# Patient Record
Sex: Female | Born: 1935 | Race: White | Hispanic: No | Smoking: Never smoker
Health system: Southern US, Community
[De-identification: ages and names within clinical notes are randomized; demographics above are authoritative.]

## PROBLEM LIST (undated history)

## (undated) DIAGNOSIS — G4730 Sleep apnea, unspecified: Secondary | ICD-10-CM

## (undated) DIAGNOSIS — E039 Hypothyroidism, unspecified: Secondary | ICD-10-CM

## (undated) DIAGNOSIS — Z9981 Dependence on supplemental oxygen: Secondary | ICD-10-CM

## (undated) DIAGNOSIS — N189 Chronic kidney disease, unspecified: Secondary | ICD-10-CM

## (undated) DIAGNOSIS — Z5189 Encounter for other specified aftercare: Secondary | ICD-10-CM

## (undated) DIAGNOSIS — J449 Chronic obstructive pulmonary disease, unspecified: Secondary | ICD-10-CM

## (undated) DIAGNOSIS — I509 Heart failure, unspecified: Secondary | ICD-10-CM

## (undated) DIAGNOSIS — I10 Essential (primary) hypertension: Secondary | ICD-10-CM

## (undated) DIAGNOSIS — G459 Transient cerebral ischemic attack, unspecified: Secondary | ICD-10-CM

## (undated) HISTORY — PX: KNEE FUSION: SUR623

## (undated) HISTORY — PX: JOINT REPLACEMENT: SHX530

## (undated) HISTORY — PX: FRACTURE SURGERY: SHX138

---

## 2006-02-16 ENCOUNTER — Ambulatory Visit: Payer: Self-pay | Admitting: Cardiology

## 2010-08-11 ENCOUNTER — Ambulatory Visit: Payer: Self-pay | Admitting: Pulmonary Disease

## 2010-08-11 ENCOUNTER — Inpatient Hospital Stay (HOSPITAL_COMMUNITY): Admission: EM | Admit: 2010-08-11 | Discharge: 2010-08-15 | Payer: Self-pay | Admitting: Internal Medicine

## 2010-08-12 ENCOUNTER — Encounter (INDEPENDENT_AMBULATORY_CARE_PROVIDER_SITE_OTHER): Payer: Self-pay | Admitting: Internal Medicine

## 2010-08-14 ENCOUNTER — Encounter: Payer: Self-pay | Admitting: Internal Medicine

## 2010-08-14 ENCOUNTER — Ambulatory Visit: Payer: Self-pay | Admitting: Thoracic Surgery

## 2010-08-20 ENCOUNTER — Ambulatory Visit: Payer: Self-pay | Admitting: Thoracic Surgery

## 2010-08-29 ENCOUNTER — Inpatient Hospital Stay (HOSPITAL_COMMUNITY)
Admission: EM | Admit: 2010-08-29 | Discharge: 2010-09-09 | Payer: Self-pay | Attending: Thoracic Surgery | Admitting: Thoracic Surgery

## 2010-09-02 ENCOUNTER — Encounter: Payer: Self-pay | Admitting: Thoracic Surgery

## 2010-09-02 ENCOUNTER — Inpatient Hospital Stay: Admission: RE | Admit: 2010-09-02 | Payer: Self-pay | Source: Home / Self Care | Admitting: Thoracic Surgery

## 2010-09-17 ENCOUNTER — Encounter
Admission: RE | Admit: 2010-09-17 | Discharge: 2010-09-17 | Payer: Self-pay | Source: Home / Self Care | Attending: Thoracic Surgery | Admitting: Thoracic Surgery

## 2010-09-17 ENCOUNTER — Ambulatory Visit: Payer: Self-pay | Admitting: Thoracic Surgery

## 2010-09-25 ENCOUNTER — Encounter: Payer: Self-pay | Admitting: Infectious Diseases

## 2010-10-21 NOTE — Consult Note (Signed)
Summary: [facility]   Imported By: Sherian Rein 08/21/2010 09:31:27  _____________________________________________________________________  External Attachment:    Type:   Image     Comment:   External Document

## 2010-10-23 NOTE — Consult Note (Signed)
[facility]: [facility]   Imported By: Florinda Marker 10/02/2010 08:38:13  _____________________________________________________________________  External Attachment:    Type:   Image     Comment:   External Document

## 2010-11-20 NOTE — Consult Note (Signed)
Veronica Mcdonald, Veronica Mcdonald                  ACCOUNT NO.:  [id number]  MEDICAL RECORD NO.:  [id number]          PATIENT TYPE:  INP  LOCATION:  3311                         FACILITY:  [facility]  PHYSICIAN:  Learta Codding, MD,FACC DATE OF BIRTH:  Apr 08, 1930  DATE OF CONSULTATION: DATE OF DISCHARGE:                                CONSULTATION   REFERRING PHYSICIAN:  Ines Bloomer, M.D.  REASON FOR CONSULTATION:  Evaluation of possible paroxysmal atrial fibrillation postoperatively.  HISTORY OF PRESENT ILLNESS:  The patient is a 77 year old female with a prior history of COPD on home oxygen, hypertension, questionable congestive heart failure, and questionable prior CVA.  The patient, per her records, reports that she has had a myocardial infarction and a cardiac catheterization, but no records were found to corroborate this finding.  The patient was referred by Dr. Sherryll Burger to Dr. Edwyna Shell because of increased difficulty of breathing and swallowing difficulties.  The patient has a known substantial bilateral substernal goiter, right greater than left.  When the patient was referred, CT scan showed mild tracheal compression, and the left substernal component has gotten large and there was also compression of the esophagus.  The patient underwent successful surgery.  She has been extubated.  She is now much improved. She does state that she is already able to swallow better and has no breathing difficulty.  She reports no chest pains.  She stated that preoperatively she had occasional palpitations, but reports none currently.  Telemetry demonstrates that the patient has occasional PACs and rare PVCs, but otherwise no significant abnormalities.  There is no evidence of atrial fibrillation.  Interestingly, the patient told me that she had a prior cardiac catheterization, but I could not find any report in our medical records regarding this.  Based on her EKG, I am also not sure that she truly has a  prior history of significant myocardial infarction.  She does have a history of COPD and chronic bronchitis.  ALLERGIES:  VANCOMYCIN, BACTRIM, LEVAQUIN, PENICILLIN, CEPHALOSPORIN.  PAST MEDICAL HISTORY: 1. Right total hip replacement. 2. Left total knee replacement subsequently with rod placement. 3. 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 4. CHF in the past. 5. Renal insufficiency. 6. Hyperkalemia. 7. COPD.  Her COPD has been followed by Dr. Maple Hudson. 8. Thyroid goiter, intrathoracic status, now post removal. 9. Incidental 1 cm left upper lobe nodule. 10.Pneumonia.  FAMILY HISTORY:  Negative for coronary artery disease.  SOCIAL HISTORY:  The patient is widowed.  The patient has grown children.  She ambulates with a walker and special boots.  No use of alcohol or tobacco.  Her daughter works as a Engineer, civil (consulting) in [place].  HOME MEDICATIONS:  Prior to admission; 1. Lisinopril. 2. Hydrocodone. 3. Gabapentin. 4. Albuterol.  CURRENT MEDICATIONS: 1. Several inhalers. 2. Enoxaparin for DVT prophylaxis. 3. Levothyroxine. 4. Metoprolol 12.5 mg p.o. b.i.d. 5. Prednisone. 6. Albuterol. 7. Tramadol.  REVIEW OF SYSTEMS:  As per HPI.  No nausea or vomiting, no fever or chills, no melena, or hematochezia.  No urinary frequency.  Preoperative palpitations.  No syncope.  Remainder of the  review of systems is otherwise negative.  PHYSICAL EXAMINATION:  VITAL SIGNS:  Blood pressure is 130/70, heart rate is 87 beats per minute, respirations are 21, 94% on 2 L oxygen. GENERAL:  Overweight white female, in no apparent distress. HEENT:  Pupils isocoric.  Conjunctivae clear. NECK:  Supple.  Normal carotid upstroke, no carotid bruit.  Status post thyroidectomy with well-healed scar in the lower part of the neck.  No cervical or supraclavicular adenopathy. LUNGS:  There are diminished breath sounds bilaterally. HEART:  Regular rate and rhythm.  Normal S1 and S2.  No murmur, rubs,  or gallops. ABDOMEN:  Soft, nontender with no rebound or guarding.  Good bowel sounds. EXTREMITIES:  No cyanosis, clubbing, or edema.  The patient wears a boot on the left leg.  Her left leg is shortened.  Surgical absence of the patella and deformity of the left knee. SKIN:  Warm and dry. NEUROLOGIC:  The patient is alert, oriented, and grossly nonfocal. Psychiatric, normal affect.  LABORATORY WORK:  White count is 13.4, hemoglobin is 13.2, creatinine is 0.54, glucose is 137, BUN is 137.  MRSA PCR is negative.  A 12-lead electrocardiogram shows normal sinus rhythm with inverted T- waves across the precordial leads.  PROBLEM LIST: 1. Rule out paroxysmal atrial ablation. 2. PAC and PVCs. 3. No definite history of coronary artery disease, although the chart     reports prior myocardial infarction, not documented. 4. Status post thyroidectomy for obstructing substernal enlarged     thyroid, compromising airway and esophagus. 5. COPD.  PLAN: 1. From a cardiac standpoint, there is actually little to do at this     point in time.  The patient is hemodynamically stable.  She has no     pathological murmurs on exam.  There is no indication to obtain an     echocardiogram. 2. I do not see any evidence of paroxysmal atrial fibrillation.  She     can continue on subcu Lovenox.  We could potentially increase her     metoprolol slightly.  Although she truly has paroxysmal atrial     fibrillation, calcium blocker probably would be a better choice.     The patient actually is on carvedilol 3.125 mg twice a day.  Again,     this is a nonselective beta blocker, and therefore, if she ought to     have wheezing with this, this can be discontinued in favor of     metoprolol.  The patient is actually brought on metoprolol and     carvedilol, and therefore we will stop carvedilol and continue on     metoprolol. 3. From a cardiac standpoint again, no further management issues     regarding her  cardiac status are needed.     Learta Codding, MD,FACC     GED/MEDQ  D:  09/06/2010  T:  09/07/2010  Job:  [id number]  cc:   Dr. Maple Hudson Dr. Sherryll Burger  Electronically Signed by Lewayne Bunting MDFACC on 11/20/2010 10:24:35 AM

## 2010-12-01 LAB — COMPREHENSIVE METABOLIC PANEL
ALT: 20 U/L (ref 0–35)
AST: 11 U/L (ref 0–37)
CO2: 30 mEq/L (ref 19–32)
Calcium: 8.5 mg/dL (ref 8.4–10.5)
Chloride: 103 mEq/L (ref 96–112)
Creatinine, Ser: 0.63 mg/dL (ref 0.4–1.2)
GFR calc Af Amer: >60 mL/min (ref >60)
GFR calc non Af Amer: >60 mL/min (ref >60)
Glucose, Bld: 123 mg/dL — ABNORMAL HIGH (ref 70–99)
Total Bilirubin: 1 mg/dL (ref 0.3–1.2)

## 2010-12-01 LAB — CBC
HCT: 40.7 % (ref 36.0–46.0)
HCT: 40.7 % (ref 36.0–46.0)
HCT: 41.6 % (ref 36.0–46.0)
Hemoglobin: 12.9 g/dL (ref 12.0–15.0)
Hemoglobin: 13.2 g/dL (ref 12.0–15.0)
Hemoglobin: 13.4 g/dL (ref 12.0–15.0)
MCH: 29.5 pg (ref 26.0–34.0)
MCHC: 31.7 g/dL (ref 30.0–36.0)
MCHC: 32.4 g/dL (ref 30.0–36.0)
MCV: 92.9 fL (ref 78.0–100.0)
MCV: 93.3 fL (ref 78.0–100.0)
RBC: 4.38 MIL/uL (ref 3.87–5.11)
RBC: 4.43 MIL/uL (ref 3.87–5.11)
RBC: 4.46 MIL/uL (ref 3.87–5.11)
RDW: 12.8 % (ref 11.5–15.5)
WBC: 14 10*3/uL — ABNORMAL HIGH (ref 4.0–10.5)

## 2010-12-01 LAB — POCT I-STAT 3, ART BLOOD GAS (G3+)
Acid-Base Excess: 1 mmol/L (ref 0.0–2.0)
Acid-Base Excess: 6 mmol/L — ABNORMAL HIGH (ref 0.0–2.0)
Bicarbonate: 26 mEq/L — ABNORMAL HIGH (ref 20.0–24.0)
Bicarbonate: 29.6 mEq/L — ABNORMAL HIGH (ref 20.0–24.0)
Bicarbonate: 36.5 mEq/L — ABNORMAL HIGH (ref 20.0–24.0)
O2 Saturation: 98 %
Patient temperature: 98.7
Patient temperature: 98.9
TCO2: 38 mmol/L (ref 0–100)
pCO2 arterial: 51.8 mmHg — ABNORMAL HIGH (ref 35.0–45.0)
pCO2 arterial: 59.1 mmHg (ref 35.0–45.0)
pH, Arterial: 7.364 (ref 7.350–7.400)
pH, Arterial: 7.397 (ref 7.350–7.400)
pH, Arterial: 7.406 — ABNORMAL HIGH (ref 7.350–7.400)
pO2, Arterial: 102 mmHg — ABNORMAL HIGH (ref 80.0–100.0)
pO2, Arterial: 104 mmHg — ABNORMAL HIGH (ref 80.0–100.0)
pO2, Arterial: 86 mmHg (ref 80.0–100.0)
pO2, Arterial: 93 mmHg (ref 80.0–100.0)

## 2010-12-01 LAB — RENAL FUNCTION PANEL
Albumin: 2.6 g/dL — ABNORMAL LOW (ref 3.5–5.2)
BUN: 17 mg/dL (ref 6–23)
CO2: 33 mEq/L — ABNORMAL HIGH (ref 19–32)
CO2: 34 mEq/L — ABNORMAL HIGH (ref 19–32)
Calcium: 8.2 mg/dL — ABNORMAL LOW (ref 8.4–10.5)
Chloride: 100 mEq/L (ref 96–112)
Chloride: 103 mEq/L (ref 96–112)
Creatinine, Ser: 0.6 mg/dL (ref 0.4–1.2)
GFR calc Af Amer: >60 mL/min (ref >60)
GFR calc non Af Amer: >60 mL/min (ref >60)
Glucose, Bld: 124 mg/dL — ABNORMAL HIGH (ref 70–99)
Glucose, Bld: 85 mg/dL (ref 70–99)
Phosphorus: 3.3 mg/dL (ref 2.3–4.6)
Potassium: 3.8 mEq/L (ref 3.5–5.1)
Potassium: 4.2 mEq/L (ref 3.5–5.1)
Sodium: 139 mEq/L (ref 135–145)
Sodium: 140 mEq/L (ref 135–145)
Sodium: 141 mEq/L (ref 135–145)

## 2010-12-01 LAB — GLUCOSE, CAPILLARY
Glucose-Capillary: 100 mg/dL — ABNORMAL HIGH (ref 70–99)
Glucose-Capillary: 123 mg/dL — ABNORMAL HIGH (ref 70–99)
Glucose-Capillary: 129 mg/dL — ABNORMAL HIGH (ref 70–99)
Glucose-Capillary: 131 mg/dL — ABNORMAL HIGH (ref 70–99)
Glucose-Capillary: 136 mg/dL — ABNORMAL HIGH (ref 70–99)
Glucose-Capillary: 141 mg/dL — ABNORMAL HIGH (ref 70–99)
Glucose-Capillary: 172 mg/dL — ABNORMAL HIGH (ref 70–99)
Glucose-Capillary: 185 mg/dL — ABNORMAL HIGH (ref 70–99)
Glucose-Capillary: 86 mg/dL (ref 70–99)
Glucose-Capillary: 90 mg/dL (ref 70–99)

## 2010-12-01 LAB — CULTURE, RESPIRATORY W GRAM STAIN

## 2010-12-01 LAB — BASIC METABOLIC PANEL
CO2: 35 mEq/L — ABNORMAL HIGH (ref 19–32)
Calcium: 8.7 mg/dL (ref 8.4–10.5)
Creatinine, Ser: 0.54 mg/dL (ref 0.4–1.2)
GFR calc Af Amer: >60 mL/min (ref >60)

## 2010-12-01 LAB — MRSA PCR SCREENING: MRSA by PCR: NEGATIVE

## 2010-12-02 LAB — CROSSMATCH
ABO/RH(D): A NEG
Unit division: 0

## 2010-12-02 LAB — BASIC METABOLIC PANEL
CO2: 28 mEq/L (ref 19–32)
CO2: 32 mEq/L (ref 19–32)
Calcium: 8.5 mg/dL (ref 8.4–10.5)
Chloride: 105 mEq/L (ref 96–112)
Chloride: 105 mEq/L (ref 96–112)
Chloride: 106 mEq/L (ref 96–112)
Chloride: 107 mEq/L (ref 96–112)
Chloride: 99 mEq/L (ref 96–112)
GFR calc Af Amer: >60 mL/min (ref >60)
GFR calc Af Amer: >60 mL/min (ref >60)
GFR calc Af Amer: >60 mL/min (ref >60)
GFR calc non Af Amer: >60 mL/min (ref >60)
GFR calc non Af Amer: >60 mL/min (ref >60)
GFR calc non Af Amer: >60 mL/min (ref >60)
Glucose, Bld: 100 mg/dL — ABNORMAL HIGH (ref 70–99)
Glucose, Bld: 104 mg/dL — ABNORMAL HIGH (ref 70–99)
Glucose, Bld: 108 mg/dL — ABNORMAL HIGH (ref 70–99)
Potassium: 3.1 mEq/L — ABNORMAL LOW (ref 3.5–5.1)
Potassium: 3.1 mEq/L — ABNORMAL LOW (ref 3.5–5.1)
Potassium: 3.7 mEq/L (ref 3.5–5.1)
Potassium: 3.9 mEq/L (ref 3.5–5.1)
Potassium: 4.1 mEq/L (ref 3.5–5.1)
Sodium: 140 mEq/L (ref 135–145)
Sodium: 141 mEq/L (ref 135–145)
Sodium: 143 mEq/L (ref 135–145)
Sodium: 143 mEq/L (ref 135–145)
Sodium: 144 mEq/L (ref 135–145)

## 2010-12-02 LAB — POCT I-STAT 3, ART BLOOD GAS (G3+)
Acid-Base Excess: 4 mmol/L — ABNORMAL HIGH (ref 0.0–2.0)
Bicarbonate: 32.4 mEq/L — ABNORMAL HIGH (ref 20.0–24.0)
Bicarbonate: 32.9 mEq/L — ABNORMAL HIGH (ref 20.0–24.0)
O2 Saturation: 100 %
Patient temperature: 97.7
Patient temperature: 98.5
Patient temperature: 98.8
TCO2: 34 mmol/L (ref 0–100)
pH, Arterial: 7.194 — CL (ref 7.350–7.400)
pO2, Arterial: 95 mmHg (ref 80.0–100.0)

## 2010-12-02 LAB — BLOOD GAS, ARTERIAL
Bicarbonate: 31.3 mEq/L — ABNORMAL HIGH (ref 20.0–24.0)
Drawn by: 32526
FIO2: 0.21 %
Patient temperature: 98.6
pH, Arterial: 7.372 (ref 7.350–7.400)
pH, Arterial: 7.446 — ABNORMAL HIGH (ref 7.350–7.400)
pO2, Arterial: 88.3 mmHg (ref 80.0–100.0)

## 2010-12-02 LAB — COMPREHENSIVE METABOLIC PANEL
ALT: 26 U/L (ref 0–35)
AST: 13 U/L (ref 0–37)
Albumin: 3.4 g/dL — ABNORMAL LOW (ref 3.5–5.2)
Alkaline Phosphatase: 79 U/L (ref 39–117)
BUN: 13 mg/dL (ref 6–23)
BUN: 19 mg/dL (ref 6–23)
BUN: 23 mg/dL (ref 6–23)
Calcium: 7.9 mg/dL — ABNORMAL LOW (ref 8.4–10.5)
Calcium: 9.8 mg/dL (ref 8.4–10.5)
Chloride: 103 mEq/L (ref 96–112)
GFR calc non Af Amer: >60 mL/min (ref >60)
Glucose, Bld: 109 mg/dL — ABNORMAL HIGH (ref 70–99)
Glucose, Bld: 140 mg/dL — ABNORMAL HIGH (ref 70–99)
Glucose, Bld: 172 mg/dL — ABNORMAL HIGH (ref 70–99)
Potassium: 3.3 mEq/L — ABNORMAL LOW (ref 3.5–5.1)
Potassium: 4.9 mEq/L (ref 3.5–5.1)
Sodium: 138 mEq/L (ref 135–145)
Sodium: 142 mEq/L (ref 135–145)
Total Bilirubin: 0.9 mg/dL (ref 0.3–1.2)
Total Bilirubin: 1 mg/dL (ref 0.3–1.2)
Total Protein: 6.7 g/dL (ref 6.0–8.3)

## 2010-12-02 LAB — CBC
HCT: 42.9 % (ref 36.0–46.0)
HCT: 45.4 % (ref 36.0–46.0)
HCT: 47.2 % — ABNORMAL HIGH (ref 36.0–46.0)
HCT: 50 % — ABNORMAL HIGH (ref 36.0–46.0)
HCT: 50.5 % — ABNORMAL HIGH (ref 36.0–46.0)
Hemoglobin: 13.5 g/dL (ref 12.0–15.0)
Hemoglobin: 14.4 g/dL (ref 12.0–15.0)
Hemoglobin: 15.2 g/dL — ABNORMAL HIGH (ref 12.0–15.0)
Hemoglobin: 15.8 g/dL — ABNORMAL HIGH (ref 12.0–15.0)
Hemoglobin: 16.2 g/dL — ABNORMAL HIGH (ref 12.0–15.0)
MCH: 29.7 pg (ref 26.0–34.0)
MCH: 30.2 pg (ref 26.0–34.0)
MCHC: 31.5 g/dL (ref 30.0–36.0)
MCHC: 31.9 g/dL (ref 30.0–36.0)
MCHC: 31.9 g/dL (ref 30.0–36.0)
MCV: 93.3 fL (ref 78.0–100.0)
MCV: 93.9 fL (ref 78.0–100.0)
MCV: 94 fL (ref 78.0–100.0)
MCV: 94.3 fL (ref 78.0–100.0)
MCV: 96.2 fL (ref 78.0–100.0)
Platelets: 100 10*3/uL — ABNORMAL LOW (ref 150–400)
Platelets: 107 10*3/uL — ABNORMAL LOW (ref 150–400)
Platelets: 130 10*3/uL — ABNORMAL LOW (ref 150–400)
RBC: 4.88 MIL/uL (ref 3.87–5.11)
RBC: 4.99 MIL/uL (ref 3.87–5.11)
RBC: 5.36 MIL/uL — ABNORMAL HIGH (ref 3.87–5.11)
RBC: 5.37 MIL/uL — ABNORMAL HIGH (ref 3.87–5.11)
RDW: 12.8 % (ref 11.5–15.5)
RDW: 12.8 % (ref 11.5–15.5)
RDW: 12.9 % (ref 11.5–15.5)
WBC: 11 10*3/uL — ABNORMAL HIGH (ref 4.0–10.5)
WBC: 16.9 10*3/uL — ABNORMAL HIGH (ref 4.0–10.5)
WBC: 8.1 10*3/uL (ref 4.0–10.5)
WBC: 8.2 10*3/uL (ref 4.0–10.5)
WBC: 9.2 10*3/uL (ref 4.0–10.5)

## 2010-12-02 LAB — DIFFERENTIAL
Basophils Absolute: 0 10*3/uL (ref 0.0–0.1)
Basophils Relative: 0 % (ref 0–1)
Eosinophils Absolute: 0 10*3/uL (ref 0.0–0.7)
Eosinophils Absolute: 0.2 10*3/uL (ref 0.0–0.7)
Eosinophils Relative: 3 % (ref 0–5)
Lymphocytes Relative: 20 % (ref 12–46)
Lymphs Abs: 1.5 10*3/uL (ref 0.7–4.0)
Monocytes Absolute: 0.8 10*3/uL (ref 0.1–1.0)
Monocytes Absolute: 1 10*3/uL (ref 0.1–1.0)
Monocytes Relative: 10 % (ref 3–12)
Neutro Abs: 8.2 10*3/uL — ABNORMAL HIGH (ref 1.7–7.7)

## 2010-12-02 LAB — CARDIAC PANEL(CRET KIN+CKTOT+MB+TROPI)
CK, MB: 2.1 ng/mL (ref 0.3–4.0)
CK, MB: 2.3 ng/mL (ref 0.3–4.0)
CK, MB: 3.4 ng/mL (ref 0.3–4.0)
Relative Index: INVALID (ref 0.0–2.5)
Troponin I: 0.02 ng/mL (ref 0.00–0.06)
Troponin I: 0.05 ng/mL (ref 0.00–0.06)
Troponin I: 0.07 ng/mL — ABNORMAL HIGH (ref 0.00–0.06)

## 2010-12-02 LAB — GLUCOSE, CAPILLARY
Glucose-Capillary: 123 mg/dL — ABNORMAL HIGH (ref 70–99)
Glucose-Capillary: 146 mg/dL — ABNORMAL HIGH (ref 70–99)
Glucose-Capillary: 150 mg/dL — ABNORMAL HIGH (ref 70–99)
Glucose-Capillary: 156 mg/dL — ABNORMAL HIGH (ref 70–99)

## 2010-12-02 LAB — URINALYSIS, MICROSCOPIC ONLY
Glucose, UA: NEGATIVE mg/dL
Hgb urine dipstick: NEGATIVE
Protein, ur: 30 mg/dL — AB
Specific Gravity, Urine: 1.015 (ref 1.005–1.030)
pH: 6 (ref 5.0–8.0)

## 2010-12-02 LAB — APTT: aPTT: 28 seconds (ref 24–37)

## 2010-12-02 LAB — CALCIUM: Calcium: 7.7 mg/dL — ABNORMAL LOW (ref 8.4–10.5)

## 2010-12-02 LAB — PROTIME-INR
INR: 1.13 (ref 0.00–1.49)
Prothrombin Time: 14.7 seconds (ref 11.6–15.2)

## 2010-12-02 LAB — T4, FREE: Free T4: 1.2 ng/dL (ref 0.80–1.80)

## 2010-12-02 LAB — MRSA PCR SCREENING: MRSA by PCR: NEGATIVE

## 2010-12-02 LAB — ABO/RH: ABO/RH(D): A NEG

## 2010-12-02 LAB — MAGNESIUM: Magnesium: 2.3 mg/dL (ref 1.5–2.5)

## 2010-12-02 LAB — HEPARIN LEVEL (UNFRACTIONATED): Heparin Unfractionated: <0.1 IU/mL — ABNORMAL LOW (ref 0.30–0.70)

## 2011-02-03 NOTE — Letter (Signed)
August 20, 2010   Gloris Manchester. [facility], MD  [address]  [place], [place] [address]   Re:  Veronica Mcdonald, Veronica Mcdonald             DOB:  11/14/1929   Dear Zola Button:   I saw the patient in the office today, and I think we discussed with  your office about tentatively doing the surgery on September 02, 2010.  The patient is looking reasonably well.  She is swallowing the liquids  well but still has trouble with soft foods and as long as she is not  very active has no problems with her dyspnea.  She still is on home O2.  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  I explained to her the risk of the procedure and that  she may need be on a ventilator for several days.  I also explained the  risks of hypocalcemia and recurrent laryngeal nerve problems.  I think  we can start off through the neck and decide whether to just do a right  thyroid lobectomy or subtotaled thyroid lobectomy and then see if we can  get the posterior mediastinal mass out through the neck.  If we cannot,  then I will have to proceed with a right thoracotomy to remove it.  Again, we went over in great detail the risk of the surgery and she  agrees to the surgery and understands the risks.  Her blood pressure was  124/80, pulse 80, respirations 18, sats were 93% on 2 L.   Ines Bloomer, M.D.  Electronically Signed   DPB/MEDQ  D:  08/20/2010  T:  08/21/2010  Job:  [id number]   cc:   Kirstie Peri, MD

## 2011-02-03 NOTE — Letter (Signed)
September 17, 2010   Gloris Manchester. [facility], MD  [address]  [place], [place] [address]   Re:  CAROLL, WEINHEIMER                  DOB:  08-02-1930   Dear Zola Button,   I saw the patient back today.  She said that she had had a family  emergency, so that I would not see her for awhile, so I went ahead and  removed her sutures.  Her wound is healed well and swelling has  decreased.  She is on calcium.  Only problem is hoarseness, which I  worry about a recurrent laryngeal nerve problem, but I let you evaluate  that when you see her.  Her chest x-ray today shows marked decrease in  her mediastinum and the area where her large thyroid gland is decreased.  I discussed the situation with the family and so far they are very  pleased with results.  I will be happy to see her again if she has any  future problems.   Ines Bloomer, M.D.  Electronically Signed   DPB/MEDQ  D:  09/17/2010  T:  09/18/2010  Job:  [id number]   cc:   Letha Cape, PA  Kirstie Peri, MD

## 2011-03-22 HISTORY — PX: THYROIDECTOMY: SHX17

## 2011-04-10 DIAGNOSIS — I509 Heart failure, unspecified: Secondary | ICD-10-CM

## 2011-04-10 DIAGNOSIS — R0602 Shortness of breath: Secondary | ICD-10-CM

## 2011-08-15 ENCOUNTER — Encounter (HOSPITAL_COMMUNITY): Payer: Self-pay | Admitting: Anesthesiology

## 2011-08-15 ENCOUNTER — Emergency Department (HOSPITAL_COMMUNITY): Payer: Medicare Other

## 2011-08-15 ENCOUNTER — Inpatient Hospital Stay: Admit: 2011-08-15 | Payer: Self-pay | Admitting: Orthopedic Surgery

## 2011-08-15 ENCOUNTER — Encounter (HOSPITAL_COMMUNITY)
Admission: EM | Disposition: A | Discharge status: Skilled Nursing Facility | Payer: Self-pay | Source: Home / Self Care | Attending: Orthopedic Surgery

## 2011-08-15 ENCOUNTER — Emergency Department (HOSPITAL_COMMUNITY): Payer: Medicare Other | Admitting: Anesthesiology

## 2011-08-15 ENCOUNTER — Inpatient Hospital Stay (HOSPITAL_COMMUNITY)
Admission: EM | Admit: 2011-08-15 | Discharge: 2011-08-19 | DRG: 502 | Disposition: A | Discharge status: Skilled Nursing Facility | Payer: Medicare Other | Attending: Orthopedic Surgery | Admitting: Orthopedic Surgery

## 2011-08-15 ENCOUNTER — Other Ambulatory Visit: Payer: Self-pay

## 2011-08-15 DIAGNOSIS — W19XXXA Unspecified fall, initial encounter: Secondary | ICD-10-CM | POA: Diagnosis present

## 2011-08-15 DIAGNOSIS — IMO0002 Reserved for concepts with insufficient information to code with codable children: Secondary | ICD-10-CM

## 2011-08-15 DIAGNOSIS — Z79899 Other long term (current) drug therapy: Secondary | ICD-10-CM

## 2011-08-15 DIAGNOSIS — S52599A Other fractures of lower end of unspecified radius, initial encounter for closed fracture: Principal | ICD-10-CM | POA: Diagnosis present

## 2011-08-15 DIAGNOSIS — S52502A Unspecified fracture of the lower end of left radius, initial encounter for closed fracture: Secondary | ICD-10-CM

## 2011-08-15 DIAGNOSIS — I10 Essential (primary) hypertension: Secondary | ICD-10-CM | POA: Diagnosis present

## 2011-08-15 DIAGNOSIS — S0003XA Contusion of scalp, initial encounter: Secondary | ICD-10-CM | POA: Diagnosis present

## 2011-08-15 DIAGNOSIS — S0083XA Contusion of other part of head, initial encounter: Secondary | ICD-10-CM

## 2011-08-15 DIAGNOSIS — Z6839 Body mass index (BMI) 39.0-39.9, adult: Secondary | ICD-10-CM

## 2011-08-15 HISTORY — DX: Essential (primary) hypertension: I10

## 2011-08-15 HISTORY — PX: ORIF WRIST FRACTURE: SHX2133

## 2011-08-15 LAB — DIFFERENTIAL
Basophils Absolute: 0 10*3/uL (ref 0.0–0.1)
Lymphocytes Relative: 15 % (ref 12–46)
Lymphs Abs: 1.8 10*3/uL (ref 0.7–4.0)
Monocytes Absolute: 0.9 10*3/uL (ref 0.1–1.0)
Neutro Abs: 8.9 10*3/uL — ABNORMAL HIGH (ref 1.7–7.7)

## 2011-08-15 LAB — BASIC METABOLIC PANEL
CO2: 29 mEq/L (ref 19–32)
Chloride: 101 mEq/L (ref 96–112)
Creatinine, Ser: 0.73 mg/dL (ref 0.50–1.10)
Glucose, Bld: 108 mg/dL — ABNORMAL HIGH (ref 70–99)

## 2011-08-15 LAB — CBC
HCT: 40.9 % (ref 36.0–46.0)
Hemoglobin: 13.2 g/dL (ref 12.0–15.0)
MCV: 95.1 fL (ref 78.0–100.0)
RBC: 4.3 MIL/uL (ref 3.87–5.11)
RDW: 13 % (ref 11.5–15.5)
WBC: 11.8 10*3/uL — ABNORMAL HIGH (ref 4.0–10.5)

## 2011-08-15 LAB — SAMPLE TO BLOOD BANK

## 2011-08-15 SURGERY — OPEN REDUCTION INTERNAL FIXATION (ORIF) WRIST FRACTURE
Anesthesia: Monitor Anesthesia Care | Site: Wrist | Laterality: Left | Wound class: Clean

## 2011-08-15 MED ORDER — OXYCODONE-ACETAMINOPHEN 5-325 MG PO TABS
1.0000 | ORAL_TABLET | ORAL | Status: DC | PRN
  Administered 2011-08-15 – 2011-08-19 (×6): 2 via ORAL
  Filled 2011-08-15 (×6): qty 2

## 2011-08-15 MED ORDER — ENOXAPARIN SODIUM 40 MG/0.4ML ~~LOC~~ SOLN
40.0000 mg | Freq: Every day | SUBCUTANEOUS | Status: DC
  Administered 2011-08-16 – 2011-08-19 (×4): 40 mg via SUBCUTANEOUS
  Filled 2011-08-15 (×4): qty 0.4

## 2011-08-15 MED ORDER — HYDROMORPHONE HCL PF 1 MG/ML IJ SOLN
0.5000 mg | INTRAMUSCULAR | Status: DC | PRN
  Administered 2011-08-15: 0.5 mg via INTRAVENOUS
  Administered 2011-08-15 – 2011-08-16 (×7): 1 mg via INTRAVENOUS
  Administered 2011-08-16: 0.5 mg via INTRAVENOUS
  Administered 2011-08-17: 1 mg via INTRAVENOUS
  Filled 2011-08-15 (×10): qty 1

## 2011-08-15 MED ORDER — PROPOFOL 10 MG/ML IV EMUL
INTRAVENOUS | Status: DC | PRN
  Administered 2011-08-15: 200 mg via INTRAVENOUS

## 2011-08-15 MED ORDER — METHOCARBAMOL 100 MG/ML IJ SOLN
500.0000 mg | Freq: Four times a day (QID) | INTRAVENOUS | Status: DC | PRN
  Filled 2011-08-15: qty 5

## 2011-08-15 MED ORDER — DIPHENHYDRAMINE HCL 12.5 MG/5ML PO ELIX
12.5000 mg | ORAL_SOLUTION | ORAL | Status: DC | PRN
  Filled 2011-08-15: qty 10

## 2011-08-15 MED ORDER — METHOCARBAMOL 500 MG PO TABS
500.0000 mg | ORAL_TABLET | Freq: Four times a day (QID) | ORAL | Status: DC | PRN
  Administered 2011-08-15 – 2011-08-17 (×5): 500 mg via ORAL
  Filled 2011-08-15 (×5): qty 1

## 2011-08-15 MED ORDER — FENTANYL CITRATE 0.05 MG/ML IJ SOLN: 25.0000 ug | INTRAMUSCULAR | Status: DC | PRN

## 2011-08-15 MED ORDER — METOCLOPRAMIDE HCL 5 MG/ML IJ SOLN
5.0000 mg | Freq: Three times a day (TID) | INTRAMUSCULAR | Status: DC | PRN
  Filled 2011-08-15: qty 2

## 2011-08-15 MED ORDER — ZOLPIDEM TARTRATE 5 MG PO TABS
5.0000 mg | ORAL_TABLET | Freq: Every evening | ORAL | Status: DC | PRN
  Administered 2011-08-16: 5 mg via ORAL
  Filled 2011-08-15: qty 1

## 2011-08-15 MED ORDER — HYDROMORPHONE HCL PF 1 MG/ML IJ SOLN: 0.2500 mg | INTRAMUSCULAR | Status: DC | PRN

## 2011-08-15 MED ORDER — ONDANSETRON HCL 4 MG/2ML IJ SOLN
4.0000 mg | Freq: Once | INTRAMUSCULAR | Status: AC
  Administered 2011-08-15: 4 mg via INTRAVENOUS
  Filled 2011-08-15: qty 2

## 2011-08-15 MED ORDER — PHENYLEPHRINE HCL 10 MG/ML IJ SOLN
INTRAMUSCULAR | Status: DC | PRN
  Administered 2011-08-15 (×3): 80 ug via INTRAVENOUS

## 2011-08-15 MED ORDER — KCL IN DEXTROSE-NACL 20-5-0.45 MEQ/L-%-% IV SOLN
INTRAVENOUS | Status: DC
  Administered 2011-08-15 – 2011-08-18 (×3): via INTRAVENOUS
  Filled 2011-08-15 (×8): qty 1000

## 2011-08-15 MED ORDER — FENTANYL CITRATE 0.05 MG/ML IJ SOLN
50.0000 ug | Freq: Once | INTRAMUSCULAR | Status: AC
  Administered 2011-08-15: 50 ug via INTRAVENOUS
  Filled 2011-08-15: qty 2

## 2011-08-15 MED ORDER — AMLODIPINE BESYLATE 5 MG PO TABS
5.0000 mg | ORAL_TABLET | Freq: Every day | ORAL | Status: DC
  Administered 2011-08-15 – 2011-08-19 (×4): 5 mg via ORAL
  Filled 2011-08-15 (×5): qty 1

## 2011-08-15 MED ORDER — ONDANSETRON HCL 4 MG PO TABS: 4.0000 mg | ORAL_TABLET | Freq: Four times a day (QID) | ORAL | Status: DC | PRN

## 2011-08-15 MED ORDER — SENNOSIDES-DOCUSATE SODIUM 8.6-50 MG PO TABS
1.0000 | ORAL_TABLET | Freq: Every evening | ORAL | Status: DC | PRN
  Filled 2011-08-15: qty 1

## 2011-08-15 MED ORDER — CEFAZOLIN SODIUM-DEXTROSE 2-3 GM-% IV SOLR
2.0000 g | Freq: Four times a day (QID) | INTRAVENOUS | Status: AC
  Administered 2011-08-15 – 2011-08-16 (×3): 2 g via INTRAVENOUS
  Filled 2011-08-15 (×3): qty 50

## 2011-08-15 MED ORDER — SERTRALINE HCL 50 MG PO TABS
50.0000 mg | ORAL_TABLET | Freq: Every day | ORAL | Status: DC
  Administered 2011-08-15 – 2011-08-19 (×5): 50 mg via ORAL
  Filled 2011-08-15 (×5): qty 1

## 2011-08-15 MED ORDER — LACTATED RINGERS IV SOLN
INTRAVENOUS | Status: DC | PRN
  Administered 2011-08-15 (×2): via INTRAVENOUS

## 2011-08-15 MED ORDER — METOPROLOL TARTRATE 25 MG PO TABS
25.0000 mg | ORAL_TABLET | Freq: Two times a day (BID) | ORAL | Status: DC
  Administered 2011-08-15 – 2011-08-19 (×8): 25 mg via ORAL
  Filled 2011-08-15 (×9): qty 1

## 2011-08-15 MED ORDER — TETANUS-DIPHTH-ACELL PERTUSSIS 5-2.5-18.5 LF-MCG/0.5 IM SUSP
0.5000 mL | Freq: Once | INTRAMUSCULAR | Status: AC
  Administered 2011-08-15: 0.5 mL via INTRAMUSCULAR
  Filled 2011-08-15: qty 0.5

## 2011-08-15 MED ORDER — HYDROCODONE-ACETAMINOPHEN 5-325 MG PO TABS
1.0000 | ORAL_TABLET | Freq: Once | ORAL | Status: AC
  Administered 2011-08-15: 1 via ORAL
  Filled 2011-08-15: qty 1

## 2011-08-15 MED ORDER — SODIUM CHLORIDE 0.9 % IV SOLN
Freq: Once | INTRAVENOUS | Status: AC
  Administered 2011-08-15: 07:00:00 via INTRAVENOUS

## 2011-08-15 MED ORDER — ONDANSETRON HCL 4 MG/2ML IJ SOLN: 4.0000 mg | Freq: Four times a day (QID) | INTRAMUSCULAR | Status: DC | PRN

## 2011-08-15 MED ORDER — MIDAZOLAM HCL 5 MG/5ML IJ SOLN
INTRAMUSCULAR | Status: DC | PRN
  Administered 2011-08-15 (×2): 1 mg via INTRAVENOUS

## 2011-08-15 MED ORDER — LEVOTHYROXINE SODIUM 100 MCG PO TABS
100.0000 ug | ORAL_TABLET | Freq: Every day | ORAL | Status: DC
  Administered 2011-08-15 – 2011-08-19 (×5): 100 ug via ORAL
  Filled 2011-08-15 (×5): qty 1

## 2011-08-15 MED ORDER — ONDANSETRON HCL 4 MG/2ML IJ SOLN: 4.0000 mg | Freq: Once | INTRAMUSCULAR | Status: DC

## 2011-08-15 MED ORDER — OXYCODONE HCL 5 MG PO TABS
5.0000 mg | ORAL_TABLET | ORAL | Status: DC | PRN
  Administered 2011-08-15 – 2011-08-17 (×6): 10 mg via ORAL
  Administered 2011-08-18 (×3): 5 mg via ORAL
  Administered 2011-08-18: 10 mg via ORAL
  Administered 2011-08-19 (×2): 5 mg via ORAL
  Filled 2011-08-15 (×2): qty 2
  Filled 2011-08-15: qty 1
  Filled 2011-08-15: qty 2
  Filled 2011-08-15: qty 1
  Filled 2011-08-15 (×2): qty 2
  Filled 2011-08-15: qty 1
  Filled 2011-08-15: qty 2
  Filled 2011-08-15: qty 1
  Filled 2011-08-15: qty 2
  Filled 2011-08-15: qty 1

## 2011-08-15 MED ORDER — METOCLOPRAMIDE HCL 10 MG PO TABS: 5.0000 mg | ORAL_TABLET | Freq: Three times a day (TID) | ORAL | Status: DC | PRN

## 2011-08-15 MED ORDER — HYDROCODONE-ACETAMINOPHEN 10-325 MG PO TABS
1.0000 | ORAL_TABLET | Freq: Four times a day (QID) | ORAL | Status: DC | PRN
  Administered 2011-08-15 – 2011-08-17 (×2): 1 via ORAL
  Filled 2011-08-15 (×2): qty 1

## 2011-08-15 MED ORDER — FENTANYL CITRATE 0.05 MG/ML IJ SOLN
INTRAMUSCULAR | Status: DC | PRN
  Administered 2011-08-15: 25 ug via INTRAVENOUS
  Administered 2011-08-15: 50 ug via INTRAVENOUS
  Administered 2011-08-15: 25 ug via INTRAVENOUS
  Administered 2011-08-15: 50 ug via INTRAVENOUS

## 2011-08-15 MED ORDER — CEFAZOLIN SODIUM 1-5 GM-% IV SOLN
INTRAVENOUS | Status: DC | PRN
  Administered 2011-08-15: 2 g via INTRAVENOUS

## 2011-08-15 MED ORDER — LISINOPRIL 20 MG PO TABS
20.0000 mg | ORAL_TABLET | Freq: Every day | ORAL | Status: DC
  Administered 2011-08-15 – 2011-08-19 (×4): 20 mg via ORAL
  Filled 2011-08-15 (×5): qty 1

## 2011-08-15 MED ORDER — ONDANSETRON HCL 4 MG/2ML IJ SOLN
INTRAMUSCULAR | Status: DC | PRN
  Administered 2011-08-15: 4 mg via INTRAVENOUS

## 2011-08-15 SURGICAL SUPPLY — 61 items
2.0MM DRILL BIT ×2 IMPLANT
2.7mmx10mm locking screw (Screw) ×2 IMPLANT
2.7mmx12mm locking screw (Screw) ×2 IMPLANT
2.7mmx12mm non-locking screw (Screw) ×2 IMPLANT
2.7mmx14mm non locking screw (Screw) ×2 IMPLANT
2.7mmx16mm Non-Locking (Screw) ×2 IMPLANT
2.7mmx16mm locking (Screw) ×2 IMPLANT
2.7mmx16mm locking screw (Screw) ×2 IMPLANT
BANDAGE ACE 4 STERILE (GAUZE/BANDAGES/DRESSINGS) ×2 IMPLANT
BANDAGE ELASTIC 3 VELCRO ST LF (GAUZE/BANDAGES/DRESSINGS) ×2 IMPLANT
BANDAGE ELASTIC 4 VELCRO ST LF (GAUZE/BANDAGES/DRESSINGS) ×2 IMPLANT
BANDAGE GAUZE ELAST BULKY 4 IN (GAUZE/BANDAGES/DRESSINGS) ×2 IMPLANT
BLADE SURG ROTATE 9660 (MISCELLANEOUS) IMPLANT
BNDG ESMARK 4X9 LF (GAUZE/BANDAGES/DRESSINGS) ×2 IMPLANT
CLOTH BEACON ORANGE TIMEOUT ST (SAFETY) ×2 IMPLANT
CORDS BIPOLAR (ELECTRODE) ×2 IMPLANT
COVER SURGICAL LIGHT HANDLE (MISCELLANEOUS) ×6 IMPLANT
CUFF TOURNIQUET SINGLE 18IN (TOURNIQUET CUFF) IMPLANT
CUFF TOURNIQUET SINGLE 24IN (TOURNIQUET CUFF) ×2 IMPLANT
DRAIN TLS ROUND 10FR (DRAIN) IMPLANT
DRAPE OEC MINIVIEW 54X84 (DRAPES) ×2 IMPLANT
DRAPE SURG 17X11 SM STRL (DRAPES) ×2 IMPLANT
DRSG ADAPTIC 3X8 NADH LF (GAUZE/BANDAGES/DRESSINGS) ×2 IMPLANT
ELECT REM PT RETURN 9FT ADLT (ELECTROSURGICAL)
ELECTRODE REM PT RTRN 9FT ADLT (ELECTROSURGICAL) IMPLANT
GAUZE KERLIX 2  STERILE LF (GAUZE/BANDAGES/DRESSINGS) ×2 IMPLANT
GAUZE SPONGE 4X4 12PLY STRL LF (GAUZE/BANDAGES/DRESSINGS) ×2 IMPLANT
GAUZE SPONGE 4X4 16PLY XRAY LF (GAUZE/BANDAGES/DRESSINGS) ×2 IMPLANT
GLOVE BIOGEL PI IND STRL 8.5 (GLOVE) ×1 IMPLANT
GLOVE BIOGEL PI INDICATOR 8.5 (GLOVE) ×1
GLOVE SURG ORTHO 8.0 STRL STRW (GLOVE) ×2 IMPLANT
GOWN PREVENTION PLUS XLARGE (GOWN DISPOSABLE) ×2 IMPLANT
GOWN STRL NON-REIN LRG LVL3 (GOWN DISPOSABLE) ×6 IMPLANT
KIT BASIN OR (CUSTOM PROCEDURE TRAY) ×2 IMPLANT
KIT ROOM TURNOVER OR (KITS) ×2 IMPLANT
MANIFOLD NEPTUNE II (INSTRUMENTS) ×2 IMPLANT
NEEDLE HYPO 25X1 1.5 SAFETY (NEEDLE) IMPLANT
NS IRRIG 1000ML POUR BTL (IV SOLUTION) ×2 IMPLANT
PACK ORTHO EXTREMITY (CUSTOM PROCEDURE TRAY) ×2 IMPLANT
PAD ARMBOARD 7.5X6 YLW CONV (MISCELLANEOUS) ×2 IMPLANT
PAD CAST 4YDX4 CTTN HI CHSV (CAST SUPPLIES) ×2 IMPLANT
PADDING CAST COTTON 4X4 STRL (CAST SUPPLIES) ×2
PADDING WEBRIL 4 STERILE (GAUZE/BANDAGES/DRESSINGS) ×2 IMPLANT
SOAP 2 % CHG 4 OZ (WOUND CARE) ×2 IMPLANT
SPLINT FIBERGLASS 3X35 (CAST SUPPLIES) ×2 IMPLANT
SPONGE GAUZE 4X4 12PLY (GAUZE/BANDAGES/DRESSINGS) ×2 IMPLANT
STRIP CLOSURE SKIN 1/2X4 (GAUZE/BANDAGES/DRESSINGS) ×2 IMPLANT
SUCTION FRAZIER TIP 10 FR DISP (SUCTIONS) ×2 IMPLANT
SUT ETHILON 4 0 PS 2 18 (SUTURE) IMPLANT
SUT MNCRL AB 4-0 PS2 18 (SUTURE) IMPLANT
SUT VIC AB 2-0 CT1 36 (SUTURE) ×6 IMPLANT
SUT VIC AB 2-0 FS1 27 (SUTURE) ×2 IMPLANT
SUT VICRYL 4-0 PS2 18IN ABS (SUTURE) ×4 IMPLANT
SYR CONTROL 10ML LL (SYRINGE) IMPLANT
SYSTEM CHEST DRAIN TLS 7FR (DRAIN) IMPLANT
TOWEL OR 17X24 6PK STRL BLUE (TOWEL DISPOSABLE) ×2 IMPLANT
TOWEL OR 17X26 10 PK STRL BLUE (TOWEL DISPOSABLE) ×2 IMPLANT
TUBE CONNECTING 12X1/4 (SUCTIONS) ×2 IMPLANT
WATER STERILE IRR 1000ML POUR (IV SOLUTION) IMPLANT
YANKAUER SUCT BULB TIP NO VENT (SUCTIONS) IMPLANT
wrist spanning plate-16hole (Plate) ×2 IMPLANT

## 2011-08-15 NOTE — ED Provider Notes (Addendum)
History     CSN: [id number] Arrival date & time: 08/15/2011 12:30 AM   First MD Initiated Contact with Patient 08/15/11 0051      Chief Complaint  Patient presents with  . Arm Injury    (Consider location/radiation/quality/duration/timing/severity/associated sxs/prior treatment) HPI This is a 77 year old white female with a mode history history of left knee fusion requiring the use of an extension boot for ambulation. She was attempting to get a drink from the referring dryer when she lost her balance and fell striking her left hand against the refrigerator and then landing on the floor striking her left for head. She complains of moderate to severe pain in the left wrist radiating up the left arm to the left shoulder. She also complains of a hematoma above the left eye. This occurred just prior to arrival. There was no loss of consciousness. She notes a skin tear to her left elbow but denies other injury. Her left wrist was splinted and she was placed in a sling by EMS prior to arrival. She took a hydrocodone at home and was given a milligram of Dilaudid by EMS with improvement in the pain. There is no motor or sensory deficit in the left hand or fingers.  Past Medical History  Diagnosis Date  . Hypertension     Past Surgical History  Procedure Date  . Fracture surgery   . Joint replacement     No family history on file.  History  Substance Use Topics  . Smoking status: Not on file  . Smokeless tobacco: Not on file  . Alcohol Use:     OB History    Grav Para Term Preterm Abortions TAB SAB Ect Mult Living                  Review of Systems  All other systems reviewed and are negative.    Allergies  Review of patient's allergies indicates not on file.  Home Medications  No current outpatient prescriptions on file.  SpO2 97%  Physical Exam General: Well-developed, well-nourished female in no acute distress; appearance consistent with age of record HENT:  normocephalic, hematoma above the left eye; no bony deformity or crepitus Eyes: pupils equal round and reactive to light; extraocular muscles intact Neck: supple; nontender Heart: regular rate and rhythm Lungs: clear to auscultation bilaterally Abdomen: soft; nontender; nondistended Extremities: Deformity, swelling and tenderness left wrist; decreased range of motion left wrist due to pain; left hand and fingers neurovascularly intact with intact tendon function but decreased range of motion due to pain; skin tear noted left elbow; pain on passive range of motion of left shoulder without deformity; surgical changes 2 left knee with shortening of the left lower extremity; extension boot on left lower leg Neurologic: Awake, alert; motor function intact in all extremities and symmetric; sensation intact in left hand and fingers; no facial droop Skin: Warm and dry Psychiatric: Normal mood and affect    ED Course  Procedures (including critical care time)    MDM   Nursing notes and vitals signs, including pulse oximetry, reviewed.  Summary of this visit's results, reviewed by myself:  Results for orders placed during the hospital encounter of 08/15/11  CBC      Component Value Range   WBC 11.8 (*) 4.0 - 10.5 (K/uL)   RBC 4.30  3.87 - 5.11 (MIL/uL)   Hemoglobin 13.2  12.0 - 15.0 (g/dL)   HCT 78.2  95.6 - 21.3 (%)   MCV 95.1  78.0 - 100.0 (fL)   MCH 30.7  26.0 - 34.0 (pg)   MCHC 32.3  30.0 - 36.0 (g/dL)   RDW 40.9  81.1 - 91.4 (%)   Platelets 133 (*) 150 - 400 (K/uL)  DIFFERENTIAL      Component Value Range   Neutrophils Relative 76  43 - 77 (%)   Neutro Abs 8.9 (*) 1.7 - 7.7 (K/uL)   Lymphocytes Relative 15  12 - 46 (%)   Lymphs Abs 1.8  0.7 - 4.0 (K/uL)   Monocytes Relative 7  3 - 12 (%)   Monocytes Absolute 0.9  0.1 - 1.0 (K/uL)   Eosinophils Relative 2  0 - 5 (%)   Eosinophils Absolute 0.2  0.0 - 0.7 (K/uL)   Basophils Relative 0  0 - 1 (%)   Basophils Absolute 0.0  0.0 -  0.1 (K/uL)  SAMPLE TO BLOOD BANK      Component Value Range   Blood Bank Specimen SAMPLE AVAILABLE FOR TESTING     Sample Expiration 08/16/2011       Imaging Studies: Dg Elbow Complete Left  08-17-2011  *RADIOLOGY REPORT*  Clinical Data: Status post fall; large laceration to the distal humerus.  Concern for elbow injury.  LEFT ELBOW - COMPLETE 3+ VIEW  Comparison: None.  Findings: There is no evidence of fracture or dislocation.  The visualized joint spaces are preserved.  No significant joint effusion is identified.  Prominent osteophytes are noted at the olecranon and radial head.  The patient's soft tissue laceration is partially characterized.  IMPRESSION:  1.  No evidence of fracture or dislocation. 2.  Degenerative change noted at the left elbow joint.  Original Report Authenticated By: Tonia Ghent, M.D.   Dg Wrist Complete Left  Aug 17, 2011  *RADIOLOGY REPORT*  Clinical Data: Status post fall; severe left wrist pain.  LEFT WRIST - COMPLETE 3+ VIEW  Comparison: None.  Findings: There is a comminuted impacted fracture involving the distal radius, with dorsal tilt and dorsal displacement of distal fragments.  There is also a mildly comminuted fracture involving the distal ulna, with mild associated displacement.  Significant degenerative change is noted at the first carpometacarpal joint, reflecting osteoarthritis.  The carpal rows are otherwise grossly unremarkable in appearance.  Significant soft tissue swelling is noted overlying the fracture.  IMPRESSION:  1.  Comminuted impacted fracture involving the distal radius, with dorsal tilt and dorsal displacement of distal fragments. 2.  Mildly comminuted fracture involving the distal ulna, with mild displacement. 3.  Significant osteoarthritis at the first carpometacarpal joint. 4.  Soft tissue swelling overlying the fracture.  Original Report Authenticated By: Tonia Ghent, M.D.   Dg Shoulder Left  08/17/2011  *RADIOLOGY REPORT*  Clinical  Data: Status post fall; severe left shoulder pain.  LEFT SHOULDER - 2+ VIEW  Comparison: None.  Findings: There is no evidence of fracture or dislocation.  The left humeral head is seated within the glenoid fossa.  Numerous loose bodies are noted within the joint space; there is narrowing at the left glenohumeral joint, with associated osteophyte formation.  Mild degenerative change is noted at the left acromioclavicular joint.  No significant soft tissue abnormalities are seen.  The visualized portions of the left lung are grossly clear.  IMPRESSION:  1.  No evidence of fracture or dislocation. 2.  Degenerative change at the left glenohumeral joint; prominent loose bodies noted within the joint space.  Original Report Authenticated By: Tonia Ghent, M.D.     EKG  Interpretation:  Date & Time: 08/15/2011 4:41 AM  Rate: 59  Rhythm: sinus bradycardia with premature atrial complexes  QRS Axis: normal  Intervals: normal  ST/T Wave abnormalities: Inverted T waves inferolaterally  Conduction Disutrbances:none  Narrative Interpretation:   Old EKG Reviewed: Rate is slower; T-wave inversions less prominent            Hanley Seamen, MD 08/15/11 0250  Hanley Seamen, MD 08/15/11 0449  Hanley Seamen, MD 08/15/11 1610

## 2011-08-15 NOTE — ED Notes (Signed)
Pt updated on plan of care, EDP will contact hand specialist for consult. Family and patient reports lost life alert, spoke with x-ray tech-states device was on patient's chest when she returned from x-ray. No signs of distress at the time.

## 2011-08-15 NOTE — Anesthesia Preprocedure Evaluation (Addendum)
Anesthesia Evaluation  Patient identified by MRN, date of birth, ID band Patient awake    Reviewed: Allergy & Precautions, H&P , NPO status   Airway Mallampati: II TM Distance: >3 FB     Dental  (+) Edentulous Lower and Edentulous Upper   Pulmonary COPD oxygen dependent,  + rhonchi        Cardiovascular + DOE Regular Normal    Neuro/Psych    GI/Hepatic   Endo/Other    Renal/GU      Musculoskeletal   Abdominal   Peds  Hematology   Anesthesia Other Findings   Reproductive/Obstetrics                           Anesthesia Physical Anesthesia Plan  ASA: III  Anesthesia Plan: MAC   Post-op Pain Management:    Induction:   Airway Management Planned: Simple Face Mask  Additional Equipment:   Intra-op Plan:   Post-operative Plan:   Informed Consent: I have reviewed the patients History and Physical, chart, labs and discussed the procedure including the risks, benefits and alternatives for the proposed anesthesia with the patient or authorized representative who has indicated his/her understanding and acceptance.     Plan Discussed with: CRNA and Surgeon  Anesthesia Plan Comments: (COPD on home O2 htn Obesity EF 55-60% by echo  Plan axillary block with MAC  Kipp Brood, MD  )        Anesthesia Quick Evaluation

## 2011-08-15 NOTE — Anesthesia Postprocedure Evaluation (Signed)
  Anesthesia Post-op Note  Patient: Veronica Mcdonald  Procedure(s) Performed:  OPEN REDUCTION INTERNAL FIXATION (ORIF) WRIST FRACTURE  Patient Location: PACU  Anesthesia Type: General  Level of Consciousness: awake, alert  and oriented  Airway and Oxygen Therapy: Patient connected to nasal cannula oxygen  Post-op Pain: none  Post-op Assessment: Post-op Vital signs reviewed and Patient's Cardiovascular Status Stable  Post-op Vital Signs: stable  Complications: No apparent anesthesia complications

## 2011-08-15 NOTE — H&P (Signed)
Veronica Mcdonald is an 77 y.o. female.   Chief Complaint: Fall this am and landed on left side  HPI: Pt c/o left arm pain. Patient and family elect to proceed with intervention for left distal radius  Past Medical History  Diagnosis Date  . Hypertension     Past Surgical History  Procedure Date  . Fracture surgery   . Joint replacement     History reviewed. No pertinent family history. Social History:  reports that she has never smoked. She does not have any smokeless tobacco history on file. She reports that she does not drink alcohol or use illicit drugs.  Allergies:  Allergies  Allergen Reactions  . Vancomycin     Medications Prior to Admission  Medication Dose Route Frequency Provider Last Rate Last Dose  . 0.9 %  sodium chloride infusion   Intravenous Once Carlisle Beers Molpus, MD 125 mL/hr at 08/15/11 0630    . fentaNYL (SUBLIMAZE) injection 25 mcg  25 mcg Intravenous Q2H PRN Gavin Pound. Ghim, MD      . fentaNYL (SUBLIMAZE) injection 50 mcg  50 mcg Intravenous Once Carlisle Beers Molpus, MD   50 mcg at 08/15/11 0631  . fentaNYL (SUBLIMAZE) injection 50 mcg  50 mcg Intravenous Once Gavin Pound. Ghim, MD   50 mcg at 08/15/11 0857  . HYDROcodone-acetaminophen (NORCO) 5-325 MG per tablet 1 tablet  1 tablet Oral Once Hanley Seamen, MD   1 tablet at 08/15/11 0313  . ondansetron (ZOFRAN) injection 4 mg  4 mg Intravenous Once Carlisle Beers Molpus, MD   4 mg at 08/15/11 0631  . TDaP (BOOSTRIX) injection 0.5 mL  0.5 mL Intramuscular Once Carlisle Beers Molpus, MD   0.5 mL at 08/15/11 0107   No current outpatient prescriptions on file as of 08/15/2011.    Results for orders placed during the hospital encounter of 08/15/11 (from the past 48 hour(s))  CBC     Status: Abnormal   Collection Time   08/15/11  4:58 AM      Component Value Range Comment   WBC 11.8 (*) 4.0 - 10.5 (K/uL)    RBC 4.30  3.87 - 5.11 (MIL/uL)    Hemoglobin 13.2  12.0 - 15.0 (g/dL)    HCT 09.8  11.9 - 14.7 (%)    MCV 95.1  78.0 - 100.0 (fL)      MCH 30.7  26.0 - 34.0 (pg)    MCHC 32.3  30.0 - 36.0 (g/dL)    RDW 82.9  56.2 - 13.0 (%)    Platelets 133 (*) 150 - 400 (K/uL)   DIFFERENTIAL     Status: Abnormal   Collection Time   08/15/11  4:58 AM      Component Value Range Comment   Neutrophils Relative 76  43 - 77 (%)    Neutro Abs 8.9 (*) 1.7 - 7.7 (K/uL)    Lymphocytes Relative 15  12 - 46 (%)    Lymphs Abs 1.8  0.7 - 4.0 (K/uL)    Monocytes Relative 7  3 - 12 (%)    Monocytes Absolute 0.9  0.1 - 1.0 (K/uL)    Eosinophils Relative 2  0 - 5 (%)    Eosinophils Absolute 0.2  0.0 - 0.7 (K/uL)    Basophils Relative 0  0 - 1 (%)    Basophils Absolute 0.0  0.0 - 0.1 (K/uL)   BASIC METABOLIC PANEL     Status: Abnormal   Collection Time  08/15/11  4:58 AM      Component Value Range Comment   Sodium 141  135 - 145 (mEq/L)    Potassium 4.1  3.5 - 5.1 (mEq/L)    Chloride 101  96 - 112 (mEq/L)    CO2 29  19 - 32 (mEq/L)    Glucose, Bld 108 (*) 70 - 99 (mg/dL)    BUN 12  6 - 23 (mg/dL)    Creatinine, Ser 1.61  0.50 - 1.10 (mg/dL)    Calcium 9.1  8.4 - 10.5 (mg/dL)    GFR calc non Af Amer 79 (*) >90 (mL/min)    GFR calc Af Amer >90  >90 (mL/min)   SAMPLE TO BLOOD BANK     Status: Normal   Collection Time   08/15/11  5:10 AM      Component Value Range Comment   Blood Bank Specimen SAMPLE AVAILABLE FOR TESTING      Sample Expiration 08/16/2011      Dg Chest 2 View  08/15/2011  *RADIOLOGY REPORT*  Clinical Data: Preoperative chest radiograph.  CHEST - 2 VIEW  Comparison: Chest radiograph performed 09/17/2010  Findings: Evaluation is suboptimal due to limitations in penetration.  The lungs are well-aerated.  Mild bibasilar opacities likely reflect atelectasis.  There is no evidence of pleural effusion or pneumothorax.  The heart is borderline enlarged; calcification is noted within the aortic arch.  No acute osseous abnormalities are seen.  IMPRESSION: Mild bibasilar opacities likely reflect atelectasis; borderline cardiomegaly  noted.  Original Report Authenticated By: Tonia Ghent, M.D.   Dg Elbow Complete Left  08/15/2011  *RADIOLOGY REPORT*  Clinical Data: Status post fall; large laceration to the distal humerus.  Concern for elbow injury.  LEFT ELBOW - COMPLETE 3+ VIEW  Comparison: None.  Findings: There is no evidence of fracture or dislocation.  The visualized joint spaces are preserved.  No significant joint effusion is identified.  Prominent osteophytes are noted at the olecranon and radial head.  The patient's soft tissue laceration is partially characterized.  IMPRESSION:  1.  No evidence of fracture or dislocation. 2.  Degenerative change noted at the left elbow joint.  Original Report Authenticated By: Tonia Ghent, M.D.   Dg Wrist Complete Left  08/15/2011  *RADIOLOGY REPORT*  Clinical Data: Status post fall; severe left wrist pain.  LEFT WRIST - COMPLETE 3+ VIEW  Comparison: None.  Findings: There is a comminuted impacted fracture involving the distal radius, with dorsal tilt and dorsal displacement of distal fragments.  There is also a mildly comminuted fracture involving the distal ulna, with mild associated displacement.  Significant degenerative change is noted at the first carpometacarpal joint, reflecting osteoarthritis.  The carpal rows are otherwise grossly unremarkable in appearance.  Significant soft tissue swelling is noted overlying the fracture.  IMPRESSION:  1.  Comminuted impacted fracture involving the distal radius, with dorsal tilt and dorsal displacement of distal fragments. 2.  Mildly comminuted fracture involving the distal ulna, with mild displacement. 3.  Significant osteoarthritis at the first carpometacarpal joint. 4.  Soft tissue swelling overlying the fracture.  Original Report Authenticated By: Tonia Ghent, M.D.   Dg Shoulder Left  08/15/2011  *RADIOLOGY REPORT*  Clinical Data: Status post fall; severe left shoulder pain.  LEFT SHOULDER - 2+ VIEW  Comparison: None.  Findings: There  is no evidence of fracture or dislocation.  The left humeral head is seated within the glenoid fossa.  Numerous loose bodies are noted within the joint space; there  is narrowing at the left glenohumeral joint, with associated osteophyte formation.  Mild degenerative change is noted at the left acromioclavicular joint.  No significant soft tissue abnormalities are seen.  The visualized portions of the left lung are grossly clear.  IMPRESSION:  1.  No evidence of fracture or dislocation. 2.  Degenerative change at the left glenohumeral joint; prominent loose bodies noted within the joint space.  Original Report Authenticated By: Tonia Ghent, M.D.    No recent illnesses  Blood pressure 112/69, pulse 68, temperature 97.6 F (36.4 C), temperature source Oral, resp. rate 18, height 5\' 1"  (1.549 m), weight 93.895 kg (207 lb), SpO2 95.00%. General Appearance:  Alert, cooperative, no distress, appears stated age  Head:  Normocephalic,large periorbital ecchymoses left eye  Eyes:  Pupil right side round, sclera white        Throat: Lips no trauma  Neck: No visible masses     Lungs:   respirations unlabored, mild rhonchi  Chest Wall:  No tenderness or deformity  Heart:  Regular rate and rhythm,  Abdomen:   Soft, non-tender,         Extremities: LUE in sugar tong splint wiggles digits, fingers warm well perfused.  Pulses: 2+ and symmetric  Skin: Skin color, texture, turgor normal     Neurologic: Normal      Assessment/Plan Comminuted left distal radius fracture  To OR for orif and possible bone grafting repair as indicated Plan discussed with patient and daughter in the emergency department. R/B/A discussed with patient and daughter.  Sharma Covert 08/15/2011, 11:33 AM

## 2011-08-15 NOTE — Progress Notes (Signed)
Orthopedic Tech Progress Note Patient Details:  Veronica Mcdonald 10-Jun-1930 [id number]  Type of Splint: Sugartong Splint Location: applied to left arm Applied sling to left arm   Gaye Pollack 08/15/2011, 3:34 AM

## 2011-08-15 NOTE — Preoperative (Signed)
Beta Blockers   Reason not to administer Beta Blockers:Hold beta blocker due to otherpending surgery

## 2011-08-15 NOTE — ED Provider Notes (Signed)
  Physical Exam  BP 113/65  Pulse 62  Temp(Src) 97.4 F (36.3 C) (Oral)  Resp 16  Ht 5\' 1"  (1.549 m)  Wt 207 lb (93.895 kg)  BMI 39.11 kg/m2  SpO2 96%  Physical Exam  ED Course  Procedures  MDM Spoke to Dr. Melvyn Novas who plans on taking pt to the OR later today.  I have written for additional pain medications.        Gavin Pound. Oletta Lamas, MD 08/15/11 [phone]

## 2011-08-15 NOTE — Transfer of Care (Signed)
Immediate Anesthesia Transfer of Care Note  Patient: Veronica Mcdonald  Procedure(s) Performed:  OPEN REDUCTION INTERNAL FIXATION (ORIF) WRIST FRACTURE  Patient Location: PACU  Anesthesia Type: General  Level of Consciousness: awake, alert  and oriented  Airway & Oxygen Therapy: Patient Spontanous Breathing and Patient connected to face mask oxygen  Post-op Assessment: Report given to PACU RN and Post -op Vital signs reviewed and stable  Post vital signs: Reviewed and stable  Complications: No apparent anesthesia complications

## 2011-08-15 NOTE — Op Note (Signed)
Veronica Mcdonald, Veronica Mcdonald NO.:  [id number]  MEDICAL RECORD NO.:  [id number]  LOCATION:  [facility]                         FACILITY:  [facility]  PHYSICIAN:  Madelynn Done, MD  DATE OF BIRTH:  [birth year]  DATE OF PROCEDURE:  08/15/2011 DATE OF DISCHARGE:                              OPERATIVE REPORT   PREOPERATIVE DIAGNOSIS:  Left wrist intra-articular distal radius fracture, 3 more fragments.  POSTOPERATIVE DIAGNOSIS:  Left wrist intra-articular distal radius fracture, 3 more fragments.  ATTENDING PHYSICIAN:  Sharma Covert IV, MD, who scrubbed and was present for the entire procedure.  ASSISTANT SURGEON:  None.  SURGICAL TREATMENT: 1. Open treatment of left wrist intra-articular distal radius fracture     3 or more fragments and with internal fixation. 2. Left wrist third dorsal compartment tendon sheath incision and     release. 3. Radiographs 3 views left wrist.  SURGICAL IMPLANTS:  Biomet distal radius spanning plate with 3 screws proximally and 3 screws distally, and combination of locking and nonlocking screws.  SURGICAL INDICATIONS:  Ms. Casler is a 77 year old female fell this morning and sustained a closed left wrist intra-articular distal radius fracture with displacement dorsally.  It was recommended the patient to undergo the above procedure.  Risks, benefits, and alternatives were discussed in detail with the patient's daughter and signed informed consent was obtained.  Risks include, but not limited to bleeding, infection, damage to nearby nerves, arteries, or tendons, nonunion, malunion, hardware failure, loss of motion of the elbow, wrist and digits, and need for further surgical intervention.  DESCRIPTION OF THE PROCEDURE:  The patient was properly identified in the preop holding area, marked with permanent marker made on left wrist indicate correct operative site.  The patient was brought back to the operating room, placed supine on the  anesthesia table where general anesthesia was administered.  The patient tolerated this well.  Well- padded tourniquet was then placed on the left brachium and sealed with 1000 drape.  Left upper extremity was prepped and draped in normal sterile fashion.  Time-out was called.  Correct site was identified, and procedure then begun.  Attention then turned to the left wrist where the spanning distal radius plate was then applied over the skin.  Imaging was then used using the mini C-arm for appropriate location of the plate.  Longitudinal incision was made directly over the long finger metacarpal.  Dissection then carried down through the extensor tendons and the metacarpal shaft was then identified.  Further longitudinal incision was made proximally at the fracture site and extended proximally.  An open reduction and internal fixation,  dissection carried down with third dorsal compartment.  It was then incised and released, the tendon was then retracted radially.  Further exposure was then carried out of the open fracture site over the fracture of 3 or more fragments were then opened and reduced.  The spanning plate was then slid beneath the extensor tendons along the dorsal surface of the distal radius for maintaining the reduction.  Creating almost buttress of the intra-articular fractures.  It was then fixed distally through the oblong screw hole with a 2.5 mm drill  bit and 2.7 mm screw and fixed proximally with a nonlocking screw of the appropriate length.  Two more locking screws were then placed proximally and distally for a total of 6 screws.  The wound was then thoroughly irrigated.  Final radiographs were then obtained.  The fascia layer over the EPL was then close to avoid penetration of the EPL directly over the plate.  Copious wound irrigation done throughout.  The tourniquet deflated.  The subcutaneous tissues closed with 4-0 Vicryl and the skin closed with a running  4-0 Prolene sutures.  Adaptic dressing, sterile compressive bandage then applied.  The patient was then placed in a short-arm volar splint, extubated, and taken to recovery room in good condition.  Intraoperative radiographs 3 views of the wrist did show bridge plate fixation in place, it was relatively good position in both planes.  Good restoration of alignment.  POSTOPERATIVE PLAN:  The patient admitted with IV antibiotics and pain control.  Discharged home when she felt she can move around with this. She is quite limited in her lower extremities, and then see her back in the office in approximately 10-14 days for wound check, suture removal, short-arm cast for a total of 6 weeks and schedule for plate removal. Radiographs at each visit.     Madelynn Done, MD     FWO/MEDQ  D:  08/15/2011  T:  08/15/2011  Job:  [id number]

## 2011-08-15 NOTE — Brief Op Note (Signed)
08/15/2011  2:07 PM  PATIENT:  Veronica Mcdonald  77 y.o. female  PRE-OPERATIVE DIAGNOSIS:  left wrist fracture  POST-OPERATIVE DIAGNOSIS:  Left wrist fracture  PROCEDURE:  Procedure(s): OPEN REDUCTION INTERNAL FIXATION (ORIF) WRIST FRACTURE  SURGEON:  Surgeon(s): Sharma Covert  PHYSICIAN ASSISTANT:   ASSISTANTS: none   ANESTHESIA:   none  EBL:  Total I/O In: 1100 [I.V.:1100] Out: 75 [Blood:75]  BLOOD ADMINISTERED:none  DRAINS: none   LOCAL MEDICATIONS USED:  NONE  SPECIMEN:  No Specimen  DISPOSITION OF SPECIMEN:  N/A  COUNTS:  YES  TOURNIQUET:   Total Tourniquet Time Documented: Upper Arm (Left) - 66 minutes  DICTATION: .Job ID [id number]  PLAN OF CARE: Admit to inpatient   PATIENT DISPOSITION:  PACU - hemodynamically stable.   Delay start of Pharmacological VTE agent (>24hrs) due to surgical blood loss or risk of bleeding:  {YES/NO/NOT APPLICABLE:20182

## 2011-08-15 NOTE — ED Notes (Signed)
Area on arm - left - near elbow cleaned, steri strips applied, telfa applied, kling wrapped around wound area.

## 2011-08-15 NOTE — ED Notes (Signed)
Pt arrived via EMS with c/o fall at home.  Left wrist has deformity, left elbow has skin tear.  No bleeding at present.  Sling applied by EMS, #20 gauge IV in Right a/c, NS at [facility].  Dilaudid 1 mg given by EMS via IV in route.  Family to arrive later.

## 2011-08-15 NOTE — ED Notes (Signed)
New and old ekg handed to Dr.Molpus at [phone]

## 2011-08-16 MED ORDER — FLUTICASONE-SALMETEROL 250-50 MCG/DOSE IN AEPB
1.0000 | INHALATION_SPRAY | Freq: Two times a day (BID) | RESPIRATORY_TRACT | Status: DC
  Administered 2011-08-17 – 2011-08-19 (×5): 1 via RESPIRATORY_TRACT
  Filled 2011-08-16: qty 14

## 2011-08-16 MED ORDER — ALBUTEROL SULFATE (5 MG/ML) 0.5% IN NEBU
2.5000 mg | INHALATION_SOLUTION | Freq: Four times a day (QID) | RESPIRATORY_TRACT | Status: DC
  Administered 2011-08-17 – 2011-08-19 (×10): 2.5 mg via RESPIRATORY_TRACT
  Filled 2011-08-16 (×11): qty 0.5

## 2011-08-16 NOTE — Progress Notes (Signed)
Physical Therapy Evaluation Patient Details Name: Veronica Mcdonald MRN: [id number] DOB: 1930-03-22 Today's Date: 08/16/2011  Problem List:  Patient Active Problem List  Diagnoses  . Hypertension    Past Medical History:  Past Medical History  Diagnosis Date  . Hypertension    Past Surgical History:  Past Surgical History  Procedure Date  . Fracture surgery   . Joint replacement     PT Assessment/Plan/Recommendation PT Assessment Clinical Impression Statement: pt is s/p wrist fx who is very motivated to return to PLOF.  pt is limited by L LE deformities and weakness since multiple surgeries.   PT Recommendation/Assessment: Patient will need skilled PT in the acute care venue PT Problem List: Decreased strength;Decreased activity tolerance;Decreased balance;Decreased mobility;Decreased knowledge of use of DME;Decreased knowledge of precautions;Pain Barriers to Discharge: None PT Therapy Diagnosis : Difficulty walking;Acute pain PT Plan PT Frequency: Min 3X/week PT Treatment/Interventions: DME instruction;Gait training;Functional mobility training;Therapeutic exercise;Balance training;Patient/family education PT Recommendation Recommendations for Other Services: OT consult Follow Up Recommendations: Skilled nursing facility Equipment Recommended: Defer to next venue PT Goals  Acute Rehab PT Goals PT Goal Formulation: With patient Time For Goal Achievement: 2 weeks Pt will go Supine/Side to Sit: with modified independence PT Goal: Supine/Side to Sit - Progress: Progressing toward goal Pt will Transfer Sit to Stand/Stand to Sit: with supervision;with upper extremity assist PT Transfer Goal: Sit to Stand/Stand to Sit - Progress: Progressing toward goal Pt will Ambulate: >150 feet;with supervision;with rolling walker PT Goal: Ambulate - Progress: Progressing toward goal  PT Evaluation Precautions/Restrictions  Precautions Precautions: Fall Required Braces or Orthoses:  Yes Other Brace/Splint: pt with L LE"boot" for LE deformity and leg length descrepency.  pt able to don with Min A Restrictions Weight Bearing Restrictions: Yes LUE Weight Bearing:  (WBAT for PFRW, assuming NWB L wrist/hand, but not written.  ) Prior Functioning  Home Living Lives With: Alone Receives Help From: Home health;Family (CG 6hrs/day x5days/wk) Type of Home: House Home Layout: One level Home Access: Level entry Home Adaptive Equipment: Bedside commode/3-in-1;Walker - rolling Arts administrator) Prior Function Level of Independence: Needs assistance with ADLs;Needs assistance with homemaking;Needs assistance with gait (Lift chair for transfers) Able to Take Stairs?:  (with A only) Driving: No Cognition Cognition Orientation Level: Oriented X4 Sensation/Coordination   Extremity Assessment RLE Assessment RLE Assessment: Within Functional Limits LLE Assessment LLE Assessment: Exceptions to WFL LLE AROM (degrees) LLE Overall AROM Comments: pt with multiple surgeries on L knee 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   Mobility (including Balance) Bed Mobility Bed Mobility: Yes Supine to Sit: HOB elevated (Comment degrees);4: Min assist (HOB ~45) Supine to Sit Details (indicate cue type and reason): cues for encouragement and to not use L wrist/hand Sitting - Scoot to Edge of Bed: 4: Min assist Transfers Transfers: Yes Sit to Stand: 3: Mod assist;With upper extremity assist;From bed Sit to Stand Details (indicate cue type and reason): cues for use of R UE and placement of L UE Stand to Sit: 4: Min assist;With upper extremity assist;To chair/3-in-1 Stand to Sit Details: cues to use R UE Ambulation/Gait Ambulation/Gait: Yes Ambulation/Gait Assistance: 3: Mod assist Ambulation/Gait Assistance Details (indicate cue type and reason): only 3 pivotal steps around to recliner.   pt's L LE remains internally rotated and inverted Assistive device: Rolling walker Stairs: No    Exercise    End of Session PT - End of  Session Equipment Utilized During Treatment: Gait belt Activity Tolerance: Patient limited by fatigue;Patient limited by pain Patient left: in chair;with call bell in reach;with family/visitor present Nurse Communication: Mobility status for transfers General Behavior During Session: [facility] for tasks performed Cognition: [facility] for tasks performed  Sunny Schlein, [place] [phone] 08/16/2011, 12:05 PM

## 2011-08-17 NOTE — Progress Notes (Signed)
Physical Therapy Treatment Patient Details Name: Veronica Mcdonald MRN: [id number] DOB: 1929-12-20 Today's Date: 08/17/2011  PT Assessment/Plan  PT - Assessment/Plan Comments on Treatment Session: Pt progressing well. Very motivated.  PT Plan: Discharge plan remains appropriate PT Frequency: Min 3X/week Follow Up Recommendations: Skilled nursing facility Equipment Recommended: Defer to next venue PT Goals  Acute Rehab PT Goals PT Transfer Goal: Sit to Stand/Stand to Sit - Progress: Progressing toward goal PT Goal: Ambulate - Progress: Progressing toward goal  PT Treatment Precautions/Restrictions  Precautions Precautions: Fall Required Braces or Orthoses: Yes Other Brace/Splint: pt with L LE"boot" for LE deformity and leg length descrepency.  pt able to don with Min A Restrictions Weight Bearing Restrictions: Yes LUE Weight Bearing:  (Assuming LUE NWB using PFRW) Mobility (including Balance) Bed Mobility Bed Mobility: No Transfers Transfers: Yes Sit to Stand: 3: Mod assist;With upper extremity assist;From chair/3-in-1 Sit to Stand Details (indicate cue type and reason): Cues for safe technique and hand positioning. A to initiate stand and to ensure balance. Stood from recliner/3n1. Stand to Sit: 3: Mod assist;To chair/3-in-1 Stand to Sit Details: A to control descent into chair. Cues for safe hand placment and positioning before sitting. Sat to 3n1/recliner.  Ambulation/Gait Ambulation/Gait: Yes Ambulation/Gait Assistance: 4: Min assist Ambulation/Gait Assistance Details (indicate cue type and reason): A for RW positioning and safety. Cues for gait sequence and for posture.  Ambulation Distance (Feet): 35 Feet Assistive device: Left platform walker Gait Pattern: Step-to pattern;Decreased stride length;Trunk flexed Gait velocity: decreased Stairs: No Wheelchair Mobility Wheelchair Mobility: No    Exercise    End of Session PT - End of Session Equipment Utilized During  Treatment: Gait belt Activity Tolerance: Patient limited by fatigue;Patient limited by pain Patient left: in chair;with call bell in reach;with family/visitor present Nurse Communication: Mobility status for transfers;Mobility status for ambulation General Behavior During Session: [facility] for tasks performed Cognition: [facility] for tasks performed  Fredrich Birks 08/17/2011, 10:42 AM 08/17/2011 Fredrich Birks PTA [phone] pager [phone] office

## 2011-08-17 NOTE — Progress Notes (Signed)
Subjective: 2 Days Post-Op Procedure(s) (LRB): OPEN REDUCTION INTERNAL FIXATION (ORIF) WRIST FRACTURE (Left) Patient reports pain as 3 on 0-10 scale.    Objective: Vital signs in last 24 hours: Temp:  [97.8 F (36.6 C)-98.8 F (37.1 C)] 98.8 F (37.1 C) (11/26 0501) Pulse Rate:  [73-81] 81  (11/26 1036) Resp:  [18-20] 20  (11/26 0501) BP: (117-121)/(54-56) 117/56 mmHg (11/26 0501) SpO2:  [92 %-96 %] 96 % (11/26 0501)  Intake/Output from previous day: 11/25 0701 - 11/26 0700 In: 1301 [P.O.:960; I.V.:341] Out: 400 [Urine:400] Intake/Output this shift: Total I/O In: 660 [P.O.:660] Out: 1100 [Urine:1100]   Basename 08/15/11 0458  HGB 13.2    Basename 08/15/11 0458  WBC 11.8*  RBC 4.30  HCT 40.9  PLT 133*    Basename 08/15/11 0458  NA 141  K 4.1  CL 101  CO2 29  BUN 12  CREATININE 0.73  GLUCOSE 108*  CALCIUM 9.1   No results found for this basename: LABPT:2,INR:2 in the last 72 hours  General appearance: alert  Neurologically intact Assessment/Plan: 2 Days Post-Op Procedure(s) (LRB): OPEN REDUCTION INTERNAL FIXATION (ORIF) WRIST FRACTURE (Left) Up with therapy Await case management/social work to help with placement  Per PT pt will likely require skilled nursing facility  [facility] W 08/17/2011, 12:24 PM

## 2011-08-17 NOTE — Progress Notes (Signed)
Utilization review completed. Valencia Kassa, RN, BSN. 08/17/11  

## 2011-08-18 NOTE — Progress Notes (Signed)
LATE ENTRY-08/17/11: Clinical Social Worker completed the psychosocial assessment, which can be found in the shadow chart. Clinical social worker will initiate a skilled nursing facility bed search once the FL-2 is completed.  08/18/11: FL-2 completed and has been placed in shadow chart for MD signature. Clinical information-including FL-2 sent to facilities in [facility]. CSW will f/u with patient and family with bed offers.  Genelle Bal, MSW, LCSW [phone]

## 2011-08-18 NOTE — Discharge Summary (Signed)
Physician Discharge Summary  Patient ID: Veronica Mcdonald MRN: [id number] DOB/AGE: 06-18-1930 77 y.o.  Admit date: 08/15/2011 Discharge date: 08/19/2011  Admission Diagnoses: left wrist fracture Past Medical History  Diagnosis Date  . Hypertension     Discharge Diagnoses:  Left wrist distal radius fracture Fall  Surgeries: Procedure(s): OPEN REDUCTION INTERNAL FIXATION (ORIF) WRIST FRACTURE on 08/15/2011    Consultants:  none  Discharged Condition: Improved  Hospital Course: Veronica Mcdonald is an 77 y.o. female who was admitted 08/15/2011 with a chief complaint  Chief Complaint  Patient presents with  . Arm Injury  , and found to have a diagnosis of left wrist fracture.  They were brought to the operating room on 08/15/2011 and underwent Procedure(s): OPEN REDUCTION INTERNAL FIXATION (ORIF) WRIST FRACTURE.    Marland Kitchen  They were given sequential compression devices, early ambulation for DVT prophylaxis.  Recent vital signs: Patient Vitals for the past 24 hrs:  BP Temp Temp src Pulse Resp SpO2  08/18/11 1408 108/68 mmHg 98.9 F (37.2 C) Oral 84  18  97 %  08/18/11 1352 - - - - - 90 %  08/18/11 0723 - - - - - 94 %  08/18/11 0600 111/63 mmHg 99.3 F (37.4 C) - 84  20  94 %  08/18/11 0129 - - - - - 95 %  08/17/11 2326 146/65 mmHg - - 88  - -  08/17/11 2228 146/65 mmHg 99 F (37.2 C) - 99  18  93 %  08/17/11 1930 - - - - - 98 %  .  Recent laboratory studies: No results found.  Discharge Medications:  Current Discharge Medication List    CONTINUE these medications which have NOT CHANGED   Details  amLODipine (NORVASC) 5 MG tablet Take 5 mg by mouth daily.      Fluticasone-Salmeterol (ADVAIR) 250-50 MCG/DOSE AEPB Inhale 1 puff into the lungs every 12 (twelve) hours.      HYDROcodone-acetaminophen (LORTAB) 10-500 MG per tablet Take 1 tablet by mouth every 6 (six) hours as needed. For pain.     levothyroxine (SYNTHROID, LEVOTHROID) 100 MCG tablet Take 100 mcg by mouth daily.        lidocaine (LIDODERM) 5 % Place 1 patch onto the skin daily. Remove & Discard patch within 12 hours or as directed by MD     lisinopril (PRINIVIL,ZESTRIL) 20 MG tablet Take 20 mg by mouth daily.      metoprolol tartrate (LOPRESSOR) 25 MG tablet Take 25 mg by mouth 2 (two) times daily.      sertraline (ZOLOFT) 50 MG tablet Take 50 mg by mouth daily.          Diagnostic Studies: Dg Chest 2 View  08/15/2011  *RADIOLOGY REPORT*  Clinical Data: Preoperative chest radiograph.  CHEST - 2 VIEW  Comparison: Chest radiograph performed 09/17/2010  Findings: Evaluation is suboptimal due to limitations in penetration.  The lungs are well-aerated.  Mild bibasilar opacities likely reflect atelectasis.  There is no evidence of pleural effusion or pneumothorax.  The heart is borderline enlarged; calcification is noted within the aortic arch.  No acute osseous abnormalities are seen.  IMPRESSION: Mild bibasilar opacities likely reflect atelectasis; borderline cardiomegaly noted.  Original Report Authenticated By: Tonia Ghent, M.D.   Dg Elbow Complete Left  08/15/2011  *RADIOLOGY REPORT*  Clinical Data: Status post fall; large laceration to the distal humerus.  Concern for elbow injury.  LEFT ELBOW - COMPLETE 3+ VIEW  Comparison: None.  Findings: There  is no evidence of fracture or dislocation.  The visualized joint spaces are preserved.  No significant joint effusion is identified.  Prominent osteophytes are noted at the olecranon and radial head.  The patient's soft tissue laceration is partially characterized.  IMPRESSION:  1.  No evidence of fracture or dislocation. 2.  Degenerative change noted at the left elbow joint.  Original Report Authenticated By: Tonia Ghent, M.D.   Dg Wrist Complete Left  08/15/2011  *RADIOLOGY REPORT*  Clinical Data: Status post fall; severe left wrist pain.  LEFT WRIST - COMPLETE 3+ VIEW  Comparison: None.  Findings: There is a comminuted impacted fracture involving the  distal radius, with dorsal tilt and dorsal displacement of distal fragments.  There is also a mildly comminuted fracture involving the distal ulna, with mild associated displacement.  Significant degenerative change is noted at the first carpometacarpal joint, reflecting osteoarthritis.  The carpal rows are otherwise grossly unremarkable in appearance.  Significant soft tissue swelling is noted overlying the fracture.  IMPRESSION:  1.  Comminuted impacted fracture involving the distal radius, with dorsal tilt and dorsal displacement of distal fragments. 2.  Mildly comminuted fracture involving the distal ulna, with mild displacement. 3.  Significant osteoarthritis at the first carpometacarpal joint. 4.  Soft tissue swelling overlying the fracture.  Original Report Authenticated By: Tonia Ghent, M.D.   Dg Shoulder Left  08/15/2011  *RADIOLOGY REPORT*  Clinical Data: Status post fall; severe left shoulder pain.  LEFT SHOULDER - 2+ VIEW  Comparison: None.  Findings: There is no evidence of fracture or dislocation.  The left humeral head is seated within the glenoid fossa.  Numerous loose bodies are noted within the joint space; there is narrowing at the left glenohumeral joint, with associated osteophyte formation.  Mild degenerative change is noted at the left acromioclavicular joint.  No significant soft tissue abnormalities are seen.  The visualized portions of the left lung are grossly clear.  IMPRESSION:  1.  No evidence of fracture or dislocation. 2.  Degenerative change at the left glenohumeral joint; prominent loose bodies noted within the joint space.  Original Report Authenticated By: Tonia Ghent, M.D.    They benefited maximally from their hospital stay and there were no complications.     Disposition: Keep bandage clean and dry Ok to [employer], Non weightbearing left wrist Ok to use platform walker to bear weight on forearm Continue with above medications  Follow-up Information     Follow up with Sharma Covert. Make an appointment in 12 days.   Contact information:   [facility] [address] [place] [address] [phone]           Signed: Sharma Covert 08/18/2011, 6:00 PM

## 2011-08-18 NOTE — Progress Notes (Signed)
CSW received a call from Jeannette in admissions at [facility] regarding patient. They are able to offer a bed and can take patient Wednesday, 08/19/11.  CSW also spoke by phone with patient's daughter Gaspar Bidding regarding bed availability at [facility]. She was assured that this information would be communicated to the doctor.  Genelle Bal, MSW, LCSW [phone]

## 2011-08-18 NOTE — Progress Notes (Signed)
Physical Therapy Treatment Patient Details Name: MADELEIN MAHADEO MRN: [id number] DOB: 1930/05/09 Today's Date: 08/18/2011  PT Assessment/Plan  PT - Assessment/Plan Comments on Treatment Session: Pt. progressing well today. Pt on 2L of O2 throughout with no signs of distress.  PT Plan: Discharge plan remains appropriate PT Frequency: Min 3X/week Follow Up Recommendations: Skilled nursing facility Equipment Recommended: Defer to next venue PT Goals  Acute Rehab PT Goals PT Transfer Goal: Sit to Stand/Stand to Sit - Progress: Progressing toward goal PT Goal: Ambulate - Progress: Progressing toward goal  PT Treatment Precautions/Restrictions  Precautions Precautions: Fall Required Braces or Orthoses: Yes Other Brace/Splint: pt with L LE"boot" for LE deformity and leg length descrepency.  pt able to don with Min A Restrictions Weight Bearing Restrictions: Yes LUE Weight Bearing: Non weight bearing Mobility (including Balance) Bed Mobility Bed Mobility: No Transfers Transfers: Yes Sit to Stand: 3: Mod assist;From chair/3-in-1;With upper extremity assist Sit to Stand Details (indicate cue type and reason): A to initiate stand and ensure balance. Cues for safe hand placement and not to push up using LUE. Praticed twice Stand to Sit: 3: Mod assist;With upper extremity assist;To chair/3-in-1 Stand to Sit Details: A to control descent and position hips and RUE. Cues for safe hand placement and to sit slowly. Ambulation/Gait Ambulation/Gait: Yes Ambulation/Gait Assistance: 4: Min assist Ambulation/Gait Assistance Details (indicate cue type and reason): A with RW and cues for safe technique and positioning within RW. Cues for posture.  Ambulation Distance (Feet): 55 Feet Assistive device: Left platform walker Gait Pattern: Step-to pattern;Decreased stride length;Trunk flexed Gait velocity: decreased Stairs: No    Exercise    End of Session PT - End of Session Equipment Utilized  During Treatment: Gait belt Activity Tolerance: Patient limited by fatigue Patient left: in chair;with call bell in reach;with family/visitor present Nurse Communication: Mobility status for transfers;Mobility status for ambulation General Behavior During Session: [facility] for tasks performed Cognition: [facility] for tasks performed  Robinette, Adline Potter 08/18/2011, 11:50 AM  08/18/2011 Fredrich Birks PTA [phone] pager [phone] office

## 2011-08-20 ENCOUNTER — Encounter (HOSPITAL_COMMUNITY): Payer: Self-pay | Admitting: Orthopedic Surgery

## 2011-08-21 NOTE — Progress Notes (Signed)
LATE ENTRY: Veronica Mcdonald discharged on 08/19/11 to [facility]. CSW facilitated transported to the facility via ambulance.  Genelle Bal, MSW, LCSW [phone]

## 2011-10-27 ENCOUNTER — Encounter (HOSPITAL_COMMUNITY): Payer: Self-pay | Admitting: *Deleted

## 2011-10-27 ENCOUNTER — Encounter (HOSPITAL_COMMUNITY): Payer: Self-pay | Admitting: Pharmacy Technician

## 2011-10-27 MED ORDER — CEFAZOLIN SODIUM-DEXTROSE 2-3 GM-% IV SOLR: 2.0000 g | INTRAVENOUS | Status: DC

## 2011-10-27 NOTE — Progress Notes (Signed)
Spoke with Dr. Glenna Durand office regarding orders. Signed & Held orders in system are from 08/15/11 surgery. Made note that order for Ancef was probably from 08/15/11. Need orders for this current surgery.

## 2011-10-27 NOTE — H&P (Signed)
Veronica Mcdonald is an 78 y.o. female.   Chief Complaint: HERE TO HAVE PLATE REMOVED  HPI: PT SUSTAINED DISTAL RADIUS FRACTURE UNDERWENT ORIF ON 11/24 SURGERY TO REMOVE THE IMPLANT SEEN IN OFFICE TODAY  Past Medical History  Diagnosis Date  . Hypertension   . COPD (chronic obstructive pulmonary disease)   . Oxygen dependent     2.5L/min  . Myocardial infarction     possible MI 30+ years ago, Dr. Margarita Mail note 09/07/10 says based on EKG  she may not have had MI    Past Surgical History  Procedure Date  . Fracture surgery   . Orif wrist fracture 08/15/2011    Procedure: OPEN REDUCTION INTERNAL FIXATION (ORIF) WRIST FRACTURE;  Surgeon: Sharma Covert;  Location: [facility] OR;  Service: Orthopedics;  Laterality: Left;  . Thyroidectomy 03/2011  . Joint replacement     hip, bilateral knee, L knee has rod from hip to foot (immobile)    History reviewed. No pertinent family history. Social History:  reports that she has never smoked. She does not have any smokeless tobacco history on file. She reports that she does not drink alcohol or use illicit drugs.  Allergies:  Allergies  Allergen Reactions  . Vancomycin Other (See Comments)    unknown    Medications Prior to Admission  Medication Dose Route Frequency Provider Last Rate Last Dose  . ceFAZolin (ANCEF) IVPB 2 g/50 mL premix  2 g Intravenous 60 min Pre-Op Sharma Covert, MD       Medications Prior to Admission  Medication Sig Dispense Refill  . amLODipine (NORVASC) 5 MG tablet Take 5 mg by mouth daily.       . Fluticasone-Salmeterol (ADVAIR) 250-50 MCG/DOSE AEPB Inhale 1 puff into the lungs every 12 (twelve) hours.        Marland Kitchen HYDROcodone-acetaminophen (LORTAB) 10-500 MG per tablet Take 1 tablet by mouth every 6 (six) hours as needed. For pain.      Marland Kitchen levothyroxine (SYNTHROID, LEVOTHROID) 100 MCG tablet Take 100 mcg by mouth daily.       Marland Kitchen lisinopril (PRINIVIL,ZESTRIL) 20 MG tablet Take 20 mg by mouth daily.        . metoprolol tartrate  (LOPRESSOR) 25 MG tablet Take 25 mg by mouth 2 (two) times daily.       . sertraline (ZOLOFT) 50 MG tablet Take 50 mg by mouth daily.         No results found for this or any previous visit (from the past 48 hour(s)). No results found.  @ROS @  There were no vitals taken for this visit. General Appearance:  Alert, cooperative, no distress, appears stated age  Head:  Normocephalic, without obvious abnormality, atraumatic  Eyes:  Pupils equal, conjunctiva/corneas clear,         Throat:   Neck: No visible masses     Lungs:   respirations unlabored  Chest Wall:  No tenderness or deformity  Heart:  Regular rate and rhythm,  Abdomen:   Soft, non-tender,         Extremities: LEFT WRIST: INCISION LOOKS GOOD ABLE TO EXTEND THUMB UNABLE TO EXTEND ALL DIGITS. FINGERS WARM WELL PERFUSED  Pulses: 2+ and symmetric  Skin: Skin color, texture, turgor normal, no rashes or lesions     Neurologic: Normal    Assessment/Plan LEFT WRIST RETAINED HARDWARE DEEP IMPLANT LEFT DISTAL RADIUS FRACTURE  LEFT WRIST HARDWARE REMOVAL  R/B/A DISCUSSED WITH PT IN OFFICE.  PT VOICED UNDERSTANDING OF PLAN CONSENT  SIGNED DAY OF SURGERY PT SEEN AND EXAMINED PRIOR TO OPERATIVE PROCEDURE/DAY OF SURGERY SITE MARKED. QUESTIONS ANSWERED WILL [facility] FOLLOWING SURGERY  Sharma Covert 10/27/2011, 6:08 PM

## 2011-10-28 ENCOUNTER — Ambulatory Visit (HOSPITAL_COMMUNITY): Payer: Medicare Other

## 2011-10-28 ENCOUNTER — Ambulatory Visit (HOSPITAL_COMMUNITY)
Admission: RE | Admit: 2011-10-28 | Discharge: 2011-10-29 | Disposition: A | Discharge status: Home / Self Care | Payer: Medicare Other | Source: Ambulatory Visit | Attending: Orthopedic Surgery | Admitting: Orthopedic Surgery

## 2011-10-28 ENCOUNTER — Encounter (HOSPITAL_COMMUNITY): Payer: Self-pay | Admitting: Vascular Surgery

## 2011-10-28 ENCOUNTER — Ambulatory Visit (HOSPITAL_COMMUNITY): Payer: Medicare Other | Admitting: Vascular Surgery

## 2011-10-28 ENCOUNTER — Encounter (HOSPITAL_COMMUNITY): Payer: Self-pay | Admitting: *Deleted

## 2011-10-28 ENCOUNTER — Encounter (HOSPITAL_COMMUNITY)
Admission: RE | Disposition: A | Discharge status: Home / Self Care | Payer: Self-pay | Source: Ambulatory Visit | Attending: Orthopedic Surgery

## 2011-10-28 DIAGNOSIS — Z472 Encounter for removal of internal fixation device: Secondary | ICD-10-CM | POA: Insufficient documentation

## 2011-10-28 DIAGNOSIS — S52502A Unspecified fracture of the lower end of left radius, initial encounter for closed fracture: Secondary | ICD-10-CM

## 2011-10-28 DIAGNOSIS — I509 Heart failure, unspecified: Secondary | ICD-10-CM | POA: Insufficient documentation

## 2011-10-28 DIAGNOSIS — Z9981 Dependence on supplemental oxygen: Secondary | ICD-10-CM | POA: Insufficient documentation

## 2011-10-28 DIAGNOSIS — J449 Chronic obstructive pulmonary disease, unspecified: Secondary | ICD-10-CM | POA: Insufficient documentation

## 2011-10-28 DIAGNOSIS — J4489 Other specified chronic obstructive pulmonary disease: Secondary | ICD-10-CM | POA: Insufficient documentation

## 2011-10-28 DIAGNOSIS — E039 Hypothyroidism, unspecified: Secondary | ICD-10-CM | POA: Insufficient documentation

## 2011-10-28 DIAGNOSIS — F22 Delusional disorders: Secondary | ICD-10-CM

## 2011-10-28 DIAGNOSIS — I10 Essential (primary) hypertension: Secondary | ICD-10-CM | POA: Insufficient documentation

## 2011-10-28 DIAGNOSIS — K08109 Complete loss of teeth, unspecified cause, unspecified class: Secondary | ICD-10-CM | POA: Insufficient documentation

## 2011-10-28 HISTORY — DX: Transient cerebral ischemic attack, unspecified: G45.9

## 2011-10-28 HISTORY — PX: HARDWARE REMOVAL: SHX979

## 2011-10-28 HISTORY — DX: Hypothyroidism, unspecified: E03.9

## 2011-10-28 HISTORY — DX: Chronic kidney disease, unspecified: N18.9

## 2011-10-28 HISTORY — DX: Dependence on supplemental oxygen: Z99.81

## 2011-10-28 HISTORY — DX: Heart failure, unspecified: I50.9

## 2011-10-28 HISTORY — DX: Chronic obstructive pulmonary disease, unspecified: J44.9

## 2011-10-28 HISTORY — DX: Encounter for other specified aftercare: Z51.89

## 2011-10-28 LAB — BASIC METABOLIC PANEL
Calcium: 10.1 mg/dL (ref 8.4–10.5)
GFR calc Af Amer: 70 mL/min — ABNORMAL LOW (ref >90)
GFR calc non Af Amer: 60 mL/min — ABNORMAL LOW (ref >90)
Glucose, Bld: 107 mg/dL — ABNORMAL HIGH (ref 70–99)
Potassium: 4.2 mEq/L (ref 3.5–5.1)
Sodium: 141 mEq/L (ref 135–145)

## 2011-10-28 LAB — CBC
Hemoglobin: 13.1 g/dL (ref 12.0–15.0)
Platelets: 145 10*3/uL — ABNORMAL LOW (ref 150–400)
RBC: 4.29 MIL/uL (ref 3.87–5.11)
WBC: 9.6 10*3/uL (ref 4.0–10.5)

## 2011-10-28 LAB — SURGICAL PCR SCREEN: Staphylococcus aureus: POSITIVE — AB

## 2011-10-28 LAB — GLUCOSE, CAPILLARY: Glucose-Capillary: 95 mg/dL (ref 70–99)

## 2011-10-28 SURGERY — REMOVAL, HARDWARE
Anesthesia: Monitor Anesthesia Care | Site: Wrist | Laterality: Left | Wound class: Clean

## 2011-10-28 MED ORDER — CLINDAMYCIN PHOSPHATE 600 MG/4ML IJ SOLN
INTRAMUSCULAR | Status: AC
  Filled 2011-10-28: qty 4

## 2011-10-28 MED ORDER — METHOCARBAMOL 100 MG/ML IJ SOLN: 500.0000 mg | Freq: Four times a day (QID) | INTRAVENOUS | Status: DC | PRN

## 2011-10-28 MED ORDER — DEXTROSE 5 % IV SOLN
INTRAVENOUS | Status: AC
  Filled 2011-10-28: qty 50

## 2011-10-28 MED ORDER — MIDAZOLAM HCL 5 MG/5ML IJ SOLN
INTRAMUSCULAR | Status: DC | PRN
  Administered 2011-10-28 (×2): 1 mg via INTRAVENOUS

## 2011-10-28 MED ORDER — LISINOPRIL 20 MG PO TABS
20.0000 mg | ORAL_TABLET | Freq: Every day | ORAL | Status: DC
  Administered 2011-10-29: 20 mg via ORAL
  Filled 2011-10-28 (×2): qty 1

## 2011-10-28 MED ORDER — METHOCARBAMOL 500 MG PO TABS: 500.0000 mg | ORAL_TABLET | Freq: Four times a day (QID) | ORAL | Status: DC | PRN

## 2011-10-28 MED ORDER — HYDROCODONE-ACETAMINOPHEN 10-500 MG PO TABS: 1.0000 | ORAL_TABLET | Freq: Four times a day (QID) | ORAL | Status: AC | PRN

## 2011-10-28 MED ORDER — OXYCODONE HCL 5 MG PO TABS: 5.0000 mg | ORAL_TABLET | ORAL | Status: DC | PRN

## 2011-10-28 MED ORDER — DOCUSATE SODIUM 100 MG PO CAPS: 100.0000 mg | ORAL_CAPSULE | Freq: Two times a day (BID) | ORAL | Status: AC

## 2011-10-28 MED ORDER — ROPIVACAINE HCL 5 MG/ML IJ SOLN
INTRAMUSCULAR | Status: DC | PRN
  Administered 2011-10-28: 150 mg via EPIDURAL

## 2011-10-28 MED ORDER — MUPIROCIN 2 % EX OINT
TOPICAL_OINTMENT | CUTANEOUS | Status: AC
  Administered 2011-10-28: 1 via NASAL
  Filled 2011-10-28: qty 22

## 2011-10-28 MED ORDER — DOCUSATE SODIUM 100 MG PO CAPS
100.0000 mg | ORAL_CAPSULE | Freq: Two times a day (BID) | ORAL | Status: DC
  Administered 2011-10-29 (×2): 100 mg via ORAL
  Filled 2011-10-28 (×3): qty 1

## 2011-10-28 MED ORDER — HYDROMORPHONE HCL PF 1 MG/ML IJ SOLN: 0.2500 mg | INTRAMUSCULAR | Status: DC | PRN

## 2011-10-28 MED ORDER — ONDANSETRON HCL 4 MG/2ML IJ SOLN: 4.0000 mg | Freq: Four times a day (QID) | INTRAMUSCULAR | Status: DC | PRN

## 2011-10-28 MED ORDER — DROPERIDOL 2.5 MG/ML IJ SOLN: 0.6250 mg | INTRAMUSCULAR | Status: DC | PRN

## 2011-10-28 MED ORDER — ONDANSETRON HCL 4 MG PO TABS: 4.0000 mg | ORAL_TABLET | Freq: Four times a day (QID) | ORAL | Status: DC | PRN

## 2011-10-28 MED ORDER — KCL IN DEXTROSE-NACL 20-5-0.45 MEQ/L-%-% IV SOLN
INTRAVENOUS | Status: DC
  Administered 2011-10-28: via INTRAVENOUS
  Filled 2011-10-28: qty 1000

## 2011-10-28 MED ORDER — FLUTICASONE-SALMETEROL 250-50 MCG/DOSE IN AEPB
1.0000 | INHALATION_SPRAY | Freq: Two times a day (BID) | RESPIRATORY_TRACT | Status: DC
  Administered 2011-10-29: 1 via RESPIRATORY_TRACT
  Filled 2011-10-28: qty 14

## 2011-10-28 MED ORDER — HYDROCODONE-ACETAMINOPHEN 10-325 MG PO TABS
1.0000 | ORAL_TABLET | ORAL | Status: DC | PRN
  Administered 2011-10-29 (×3): 2 via ORAL
  Filled 2011-10-28 (×3): qty 2

## 2011-10-28 MED ORDER — VITAMIN C 500 MG PO TABS
1000.0000 mg | ORAL_TABLET | Freq: Every day | ORAL | Status: DC
  Administered 2011-10-29: 1000 mg via ORAL
  Filled 2011-10-28: qty 2

## 2011-10-28 MED ORDER — SERTRALINE HCL 50 MG PO TABS
50.0000 mg | ORAL_TABLET | Freq: Every day | ORAL | Status: DC
  Administered 2011-10-28 – 2011-10-29 (×2): 50 mg via ORAL
  Filled 2011-10-28 (×2): qty 1

## 2011-10-28 MED ORDER — LACTATED RINGERS IV SOLN
INTRAVENOUS | Status: DC | PRN
  Administered 2011-10-28: 17:00:00 via INTRAVENOUS

## 2011-10-28 MED ORDER — ADULT MULTIVITAMIN W/MINERALS CH
1.0000 | ORAL_TABLET | Freq: Every day | ORAL | Status: DC
  Administered 2011-10-29: 1 via ORAL
  Filled 2011-10-28: qty 1

## 2011-10-28 MED ORDER — LEVOTHYROXINE SODIUM 100 MCG PO TABS
100.0000 ug | ORAL_TABLET | Freq: Every day | ORAL | Status: DC
  Administered 2011-10-28 – 2011-10-29 (×2): 100 ug via ORAL
  Filled 2011-10-28 (×2): qty 1

## 2011-10-28 MED ORDER — CLINDAMYCIN PHOSPHATE 600 MG/50ML IV SOLN
INTRAVENOUS | Status: DC | PRN
  Administered 2011-10-28: 600 mg via INTRAVENOUS

## 2011-10-28 MED ORDER — CLINDAMYCIN PHOSPHATE 600 MG/50ML IV SOLN
600.0000 mg | Freq: Three times a day (TID) | INTRAVENOUS | Status: DC
  Administered 2011-10-29: 600 mg via INTRAVENOUS
  Filled 2011-10-28 (×5): qty 50

## 2011-10-28 MED ORDER — AMLODIPINE BESYLATE 5 MG PO TABS
5.0000 mg | ORAL_TABLET | Freq: Every day | ORAL | Status: DC
  Administered 2011-10-29: 5 mg via ORAL
  Filled 2011-10-28 (×2): qty 1

## 2011-10-28 MED ORDER — METOPROLOL TARTRATE 25 MG PO TABS
25.0000 mg | ORAL_TABLET | Freq: Two times a day (BID) | ORAL | Status: DC
  Administered 2011-10-29: 25 mg via ORAL
  Filled 2011-10-28 (×3): qty 1

## 2011-10-28 MED ORDER — FENTANYL CITRATE 0.05 MG/ML IJ SOLN
INTRAMUSCULAR | Status: DC | PRN
  Administered 2011-10-28 (×2): 50 ug via INTRAVENOUS
  Administered 2011-10-28: 25 ug via INTRAVENOUS

## 2011-10-28 MED ORDER — MORPHINE SULFATE 2 MG/ML IJ SOLN: 1.0000 mg | INTRAMUSCULAR | Status: DC | PRN

## 2011-10-28 SURGICAL SUPPLY — 51 items
BANDAGE ELASTIC 3 VELCRO ST LF (GAUZE/BANDAGES/DRESSINGS) IMPLANT
BANDAGE ELASTIC 4 VELCRO ST LF (GAUZE/BANDAGES/DRESSINGS) ×2 IMPLANT
BANDAGE GAUZE ELAST BULKY 4 IN (GAUZE/BANDAGES/DRESSINGS) ×2 IMPLANT
BLADE SURG ROTATE 9660 (MISCELLANEOUS) IMPLANT
BNDG ESMARK 4X9 LF (GAUZE/BANDAGES/DRESSINGS) IMPLANT
CLOTH BEACON ORANGE TIMEOUT ST (SAFETY) ×2 IMPLANT
CORDS BIPOLAR (ELECTRODE) ×2 IMPLANT
COVER SURGICAL LIGHT HANDLE (MISCELLANEOUS) ×2 IMPLANT
CUFF TOURNIQUET SINGLE 18IN (TOURNIQUET CUFF) IMPLANT
CUFF TOURNIQUET SINGLE 24IN (TOURNIQUET CUFF) ×2 IMPLANT
DRAIN TLS ROUND 10FR (DRAIN) IMPLANT
DRAPE OEC MINIVIEW 54X84 (DRAPES) ×2 IMPLANT
DRAPE SURG 17X11 SM STRL (DRAPES) IMPLANT
DRSG ADAPTIC 3X8 NADH LF (GAUZE/BANDAGES/DRESSINGS) ×2 IMPLANT
DRSG EMULSION OIL 3X16 NADH (GAUZE/BANDAGES/DRESSINGS) ×2 IMPLANT
ELECT REM PT RETURN 9FT ADLT (ELECTROSURGICAL)
ELECTRODE REM PT RTRN 9FT ADLT (ELECTROSURGICAL) IMPLANT
GAUZE KERLIX 2  STERILE LF (GAUZE/BANDAGES/DRESSINGS) ×2 IMPLANT
GAUZE SPONGE 4X4 12PLY STRL LF (GAUZE/BANDAGES/DRESSINGS) ×2 IMPLANT
GAUZE SPONGE 4X4 16PLY XRAY LF (GAUZE/BANDAGES/DRESSINGS) ×2 IMPLANT
GLOVE BIOGEL PI IND STRL 8.5 (GLOVE) IMPLANT
GLOVE BIOGEL PI INDICATOR 8.5 (GLOVE)
GLOVE SURG ORTHO 8.0 STRL STRW (GLOVE) ×2 IMPLANT
GOWN PREVENTION PLUS XLARGE (GOWN DISPOSABLE) ×2 IMPLANT
GOWN STRL NON-REIN LRG LVL3 (GOWN DISPOSABLE) ×2 IMPLANT
KIT BASIN OR (CUSTOM PROCEDURE TRAY) ×2 IMPLANT
KIT ROOM TURNOVER OR (KITS) ×2 IMPLANT
MANIFOLD NEPTUNE II (INSTRUMENTS) IMPLANT
NEEDLE 22X1 1/2 (OR ONLY) (NEEDLE) IMPLANT
NS IRRIG 1000ML POUR BTL (IV SOLUTION) ×2 IMPLANT
PACK ORTHO EXTREMITY (CUSTOM PROCEDURE TRAY) ×2 IMPLANT
PAD ARMBOARD 7.5X6 YLW CONV (MISCELLANEOUS) ×4 IMPLANT
PAD CAST 4YDX4 CTTN HI CHSV (CAST SUPPLIES) ×1 IMPLANT
PADDING CAST COTTON 4X4 STRL (CAST SUPPLIES) ×1
PADDING WEBRIL 4 STERILE (GAUZE/BANDAGES/DRESSINGS) ×2 IMPLANT
SOAP 2 % CHG 4 OZ (WOUND CARE) ×2 IMPLANT
SPLINT FIBERGLASS 3X35 (CAST SUPPLIES) ×2 IMPLANT
SPONGE GAUZE 4X4 12PLY (GAUZE/BANDAGES/DRESSINGS) ×2 IMPLANT
SPONGE LAP 4X18 X RAY DECT (DISPOSABLE) IMPLANT
STRIP CLOSURE SKIN 1/2X4 (GAUZE/BANDAGES/DRESSINGS) ×2 IMPLANT
SUCTION FRAZIER TIP 10 FR DISP (SUCTIONS) ×2 IMPLANT
SUT ETHILON 4 0 PS 2 18 (SUTURE) IMPLANT
SUT MNCRL AB 4-0 PS2 18 (SUTURE) IMPLANT
SUT VIC AB 3-0 FS2 27 (SUTURE) IMPLANT
SYR CONTROL 10ML LL (SYRINGE) IMPLANT
SYSTEM CHEST DRAIN TLS 7FR (DRAIN) IMPLANT
TOWEL OR 17X24 6PK STRL BLUE (TOWEL DISPOSABLE) ×2 IMPLANT
TOWEL OR 17X26 10 PK STRL BLUE (TOWEL DISPOSABLE) ×2 IMPLANT
TUBE CONNECTING 12X1/4 (SUCTIONS) ×2 IMPLANT
WATER STERILE IRR 1000ML POUR (IV SOLUTION) IMPLANT
YANKAUER SUCT BULB TIP NO VENT (SUCTIONS) IMPLANT

## 2011-10-28 NOTE — Addendum Note (Signed)
Addendum  created 10/28/11 1901 by Taren Toops, CRNA   Modules edited:Anesthesia Flowsheet    

## 2011-10-28 NOTE — Anesthesia Procedure Notes (Signed)
Anesthesia Regional Block:  Supraclavicular block  Pre-Anesthetic Checklist: ,, timeout performed, Correct Patient, Correct Site, Correct Laterality, Correct Procedure,, site marked, risks and benefits discussed, Surgical consent,  Pre-op evaluation,  At surgeon's request and post-op pain management  Laterality: Left  Prep: chloraprep       Needles:  Injection technique: Single-shot  Needle Type: Echogenic Stimulator Needle     Needle Length: 5cm 5 cm Needle Gauge: 22 and 22 G    Additional Needles:  Procedures: ultrasound guided and nerve stimulator Supraclavicular block  Nerve Stimulator or Paresthesia:  Response: bicep contraction, 0.45 mA,   Additional Responses:   Narrative:  Start time: 10/28/2011 4:34 PM End time: 10/28/2011 4:44 PM Injection made incrementally with aspirations every 5 mL.  Performed by: Personally  Anesthesiologist: J. Adonis Huguenin, MD  Additional Notes: Functioning IV was confirmed and monitors applied.  A 50mm 22ga echogenic arrow stimulator was used. Sterile prep and drape,hand hygiene and sterile gloves were used.Ultrasound guidance: relevent anatomy identified, needle position confirmed, local anesthetic spread visualized around nerve(s)., vascular puncture avoided.  Image printed for medical record.  Negative aspiration and negative test dose prior to incremental administration of local anesthetic. The patient tolerated the procedure well.  Interscalene brachial plexus block

## 2011-10-28 NOTE — Brief Op Note (Signed)
10/28/2011  6:00 PM  PATIENT:  Veronica Mcdonald  78 y.o. female  PRE-OPERATIVE DIAGNOSIS:  LEFT WRIST RETAINED HARDWARE  POST-OPERATIVE DIAGNOSIS:  * No post-op diagnosis entered *  PROCEDURE:  Procedure(s): HARDWARE REMOVAL  SURGEON:  Surgeon(s): Sharma Covert, MD  PHYSICIAN ASSISTANT:   ASSISTANTS: none   ANESTHESIA:   regional  EBL:  Total I/O In: 300 [I.V.:300] Out: -   BLOOD ADMINISTERED:none  DRAINS: none   LOCAL MEDICATIONS USED:  NONE  SPECIMEN:  No Specimen  DISPOSITION OF SPECIMEN:  N/A  COUNTS:  YES  TOURNIQUET:   Total Tourniquet Time Documented: Upper Arm (Left) - 20 minutes  DICTATION: .Other Dictation: Dictation Number [id number]  PLAN OF CARE: Discharge to home after PACU  PATIENT DISPOSITION:  PACU - hemodynamically stable.   Delay start of Pharmacological VTE agent (>24hrs) due to surgical blood loss or risk of bleeding:  {YES/NO/NOT APPLICABLE:20182

## 2011-10-28 NOTE — Transfer of Care (Signed)
Immediate Anesthesia Transfer of Care Note  Patient: Veronica Mcdonald  Procedure(s) Performed:  HARDWARE REMOVAL  Patient Location: PACU  Anesthesia Type: Regional  Level of Consciousness: awake and alert   Airway & Oxygen Therapy: Patient Spontanous Breathing and Patient connected to nasal cannula oxygen  Post-op Assessment: Report given to PACU RN and Post -op Vital signs reviewed and stable  Post vital signs: Reviewed and stable  Complications: No apparent anesthesia complications

## 2011-10-28 NOTE — Progress Notes (Signed)
Patient admitted to unit 5000, room 5031 from PACU via w/c.  Oriented to room.  Call bell within reach.  Patient without distress.

## 2011-10-28 NOTE — Addendum Note (Signed)
Addendum  created 10/28/11 1901 by Alanda Amass, CRNA   Modules edited:Anesthesia Flowsheet

## 2011-10-28 NOTE — Anesthesia Postprocedure Evaluation (Signed)
Anesthesia Post Note  Patient: Veronica Mcdonald  Procedure(s) Performed:  HARDWARE REMOVAL  Anesthesia type: MAC  Patient location: PACU  Post pain: Pain level controlled  Post assessment: Patient's Cardiovascular Status Stable  Last Vitals:  Filed Vitals:   10/28/11 1815  BP: 163/87  Pulse:   Temp:   Resp: 16    Post vital signs: Reviewed and stable  Level of consciousness: sedated  Complications: No apparent anesthesia complications

## 2011-10-28 NOTE — Anesthesia Preprocedure Evaluation (Addendum)
Anesthesia Evaluation  Patient identified by MRN, date of birth, ID band Patient awake    Reviewed: Allergy & Precautions, H&P , NPO status , Patient's Chart, lab work & pertinent test results, reviewed documented beta blocker date and time , Unable to perform ROS - Chart review only  History of Anesthesia Complications Negative for: history of anesthetic complications  Airway Mallampati: II TM Distance: >3 FB     Dental  (+) Edentulous Upper and Edentulous Lower   Pulmonary COPD oxygen dependent,  clear to auscultation  Pulmonary exam normal       Cardiovascular hypertension, Pt. on home beta blockers +CHF Regular Normal    Neuro/Psych TIA   GI/Hepatic negative GI ROS, Neg liver ROS,   Endo/Other  Hypothyroidism Morbid obesity  Renal/GU negative Renal ROS     Musculoskeletal   Abdominal   Peds  Hematology   Anesthesia Other Findings   Reproductive/Obstetrics                           Anesthesia Physical Anesthesia Plan  ASA: III  Anesthesia Plan: MAC   Post-op Pain Management: MAC Combined w/ Regional for Post-op pain   Induction:   Airway Management Planned: LMA  Additional Equipment:   Intra-op Plan:   Post-operative Plan: Extubation in OR  Informed Consent: I have reviewed the patients History and Physical, chart, labs and discussed the procedure including the risks, benefits and alternatives for the proposed anesthesia with the patient or authorized representative who has indicated his/her understanding and acceptance.   Dental advisory given  Plan Discussed with: CRNA, Anesthesiologist and Surgeon  Anesthesia Plan Comments:         Anesthesia Quick Evaluation

## 2011-10-28 NOTE — Progress Notes (Signed)
R/B/A DISCUSSED WITH PT IN OFFICE.  PT VOICED UNDERSTANDING OF PLAN CONSENT SIGNED DAY OF SURGERY PT SEEN AND EXAMINED PRIOR TO OPERATIVE PROCEDURE/DAY OF SURGERY SITE MARKED. QUESTIONS ANSWERED WILL GO HOME FOLLOWING SURGERY  

## 2011-10-28 NOTE — Progress Notes (Signed)
PHARMACY - ANTIBIOTIC CONSULT  INITIAL NOTE  Pharmacy Consult for: Clindamycin  Indication: Post-op orthopedic hand surgery   Patient Data:   Allergies: Allergies  Allergen Reactions  . Bactrim (Sulfamethoxazole-Trimethoprim)   . Cephalosporins   . Penicillins   . Vancomycin Other (See Comments)    unknown    Patient Measurements: Height: 5\' 2"  (157.5 cm) Weight: 200 lb (90.719 kg) IBW/kg (Calculated) : 50.1    Vital Signs: Temp:  [97.4 F (36.3 C)-98.2 F (36.8 C)] 97.4 F (36.3 C) (02/06 2053) Pulse Rate:  [71-85] 79  (02/06 2053) Resp:  [16-26] 20  (02/06 2053) BP: (116-173)/(51-92) 116/51 mmHg (02/06 2053) SpO2:  [94 %-100 %] 98 % (02/06 2053) Weight:  [200 lb (90.719 kg)] 200 lb (90.719 kg) (02/06 1237)  Intake/Output from previous day:  Intake/Output Summary (Last 24 hours) at 10/28/11 2312 Last data filed at 10/28/11 1900  Gross per 24 hour  Intake    420 ml  Output    410 ml  Net     10 ml    Labs:  Basename 10/28/11 1209  WBC 9.6  HGB 13.1  PLT 145*  LABCREA --  CREATININE 0.89   Estimated Creatinine Clearance: 53.6 ml/min (by C-G formula based on Cr of 0.89). No results found for this basename: VANCOTROUGH:2,VANCOPEAK:2,VANCORANDOM:2,GENTTROUGH:2,GENTPEAK:2,GENTRANDOM:2,TOBRATROUGH:2,TOBRAPEAK:2,TOBRARND:2,AMIKACINPEAK:2,AMIKACINTROU:2,AMIKACIN:2, in the last 72 hours   Microbiology: Recent Results (from the past 720 hour(s))  SURGICAL PCR SCREEN     Status: Abnormal   Collection Time   10/28/11 12:05 PM      Component Value Range Status Comment   MRSA, PCR POSITIVE (*) NEGATIVE  Final    Staphylococcus aureus POSITIVE (*) NEGATIVE  Final     Medical History: Past Medical History  Diagnosis Date  . Hypertension   . COPD (chronic obstructive pulmonary disease)   . Oxygen dependent     2.5L/min  . TIA (transient ischemic attack)   . CHF (congestive heart failure)     h/o  . Chronic kidney disease     h/o renal insufficiency  . Blood  transfusion   . Hypothyroidism     Scheduled Medications:     . amLODipine  5 mg Oral Daily  . docusate sodium  100 mg Oral BID  . Fluticasone-Salmeterol  1 puff Inhalation Q12H  . levothyroxine  100 mcg Oral Daily  . lisinopril  20 mg Oral Daily  . metoprolol tartrate  25 mg Oral BID  . mulitivitamin with minerals  1 tablet Oral Daily  . mupirocin ointment      . sertraline  50 mg Oral Daily  . vitamin C  1,000 mg Oral Daily  . DISCONTD:  ceFAZolin (ANCEF) IV  2 g Intravenous 60 min Pre-Op      Assessment:  78 y.o. female s/p left wrist hardware removal. Pharmacy consulted to manage clindamycin.    Plan:  1. Clindamycin 600mg  IV Q8H.  2. Pharmacy will sign-off as no dose adjustments anticipated.    Dineen Kid Thad Ranger, PharmD 10/28/2011, 11:12 PM

## 2011-10-29 MED ORDER — CHLORHEXIDINE GLUCONATE CLOTH 2 % EX PADS: 6.0000 | MEDICATED_PAD | Freq: Every day | CUTANEOUS | Status: DC

## 2011-10-29 MED ORDER — MUPIROCIN 2 % EX OINT
1.0000 "application " | TOPICAL_OINTMENT | Freq: Two times a day (BID) | CUTANEOUS | Status: DC
  Administered 2011-10-29: 1 via NASAL
  Filled 2011-10-29: qty 22

## 2011-10-29 NOTE — Progress Notes (Signed)
Physical Therapy Evaluation Patient Details Name: Veronica Mcdonald MRN: [id number] DOB: 1930-07-22 Today's Date: 10/29/2011  Problem List:  Patient Active Problem List  Diagnoses  . Hypertension    Past Medical History:  Past Medical History  Diagnosis Date  . Hypertension   . COPD (chronic obstructive pulmonary disease)   . Oxygen dependent     2.5L/min  . TIA (transient ischemic attack)   . CHF (congestive heart failure)     h/o  . Chronic kidney disease     h/o renal insufficiency  . Blood transfusion   . Hypothyroidism    Past Surgical History:  Past Surgical History  Procedure Date  . Fracture surgery   . Orif wrist fracture 08/15/2011    Procedure: OPEN REDUCTION INTERNAL FIXATION (ORIF) WRIST FRACTURE;  Surgeon: Sharma Covert;  Location: [facility] OR;  Service: Orthopedics;  Laterality: Left;  . Thyroidectomy 03/2011  . Joint replacement     hip, bilateral knee, L knee has rod from hip to foot (immobile)  . Knee fusion     left    PT Assessment/Plan/Recommendation PT Assessment Clinical Impression Statement: Pt is a 78 y/o female admitted s/p left wrist hardware removal.  Pt modified independent with all mobility and does not have any needs for d/c (including no DME/follow-up PT needs).  Signing off. PT Recommendation/Assessment: Patent does not need any further PT services No Skilled PT: All education completed;Patient at baseline level of functioning;Patient will have necessary level of assist by caregiver at discharge;Patient is modified independent with all activity/mobility PT Recommendation Follow Up Recommendations: No PT follow up Equipment Recommended: None recommended by PT  PT Evaluation Precautions/Restrictions  Precautions Precautions: Fall Required Braces or Orthoses: Yes (Pt has left LE boot PTA.) Restrictions Weight Bearing Restrictions: Yes LUE Weight Bearing: Weight bear through elbow only (Pt may use in platform walker only.) Prior Functioning    Home Living Lives With: Alone Receives Help From: Personal care attendant (6 hours/day.) Type of Home: House Home Layout: One level Home Access: Level entry Home Adaptive Equipment: Walker - rolling (With platform attachment.) Prior Function Level of Independence: Requires assistive device for independence;Needs assistance with ADLs;Needs assistance with homemaking;Independent with transfers Able to Take Stairs?: No Driving: No Vocation: Retired Producer, television/film/video: Awake/alert Overall Cognitive Status: Appears within functional limits for tasks assessed Orientation Level: Oriented X4 Sensation/Coordination Sensation Light Touch: Appears Intact Stereognosis: Not tested Hot/Cold: Not tested Proprioception: Not tested Coordination Gross Motor Movements are Fluid and Coordinated: Yes Fine Motor Movements are Fluid and Coordinated: Yes Extremity Assessment RUE Assessment RUE Assessment: Within Functional Limits LUE Assessment LUE Assessment: Within Functional Limits RLE Assessment RLE Assessment: Within Functional Limits LLE Assessment LLE Assessment: Within Functional Limits (Premorbid deficits not affecting mobility.) Mobility (including Balance) Bed Mobility Bed Mobility: No Transfers Transfers: Yes Sit to Stand: 6: Modified independent (Device/Increase time) Stand to Sit: 6: Modified independent (Device/Increase time) Ambulation/Gait Ambulation/Gait: Yes Ambulation/Gait Assistance: 6: Modified independent (Device/Increase time) Ambulation Distance (Feet): 100 Feet Assistive device: Left platform walker Gait Pattern: Step-through pattern;Trunk flexed Stairs: No Wheelchair Mobility Wheelchair Mobility: No  Posture/Postural Control Posture/Postural Control: No significant limitations Balance Balance Assessed: No End of Session PT - End of Session Equipment Utilized During Treatment: Gait belt Activity Tolerance: Patient tolerated treatment  well Patient left: in chair;with call bell in reach;with family/visitor present Nurse Communication: Mobility status for transfers;Mobility status for ambulation General Behavior During Session: [facility] for tasks performed Cognition: [facility] for tasks performed  Cephus Shelling 10/29/2011,  2:21 PM  10/29/2011 Cephus Shelling, PT, DPT [phone]

## 2011-10-29 NOTE — Op Note (Signed)
Veronica Mcdonald, Veronica Mcdonald NO.:  [id number]  MEDICAL RECORD NO.:  [id number]  LOCATION:  5031                         FACILITY:  [facility]  PHYSICIAN:  Madelynn Done, MD  DATE OF BIRTH:  August 05, 1930  DATE OF PROCEDURE:  10/28/2011 DATE OF DISCHARGE:                              OPERATIVE REPORT   PREOPERATIVE DIAGNOSIS:  Left wrist intraarticular distal radius fracture with retained hardware.  POSTOPERATIVE DIAGNOSIS:  Left wrist intraarticular distal radius fracture with retained hardware.  ATTENDING PHYSICIAN: Madelynn Done, MD, who scrubbed and present for the entire procedure.  ASSISTANT SURGEON:  None.  ANESTHESIA:  Supraclavicular block performed by Dr. Claybon Jabs with IV sedation.  SURGICAL PROCEDURES: 1. Left wrist deep implant removal of plates and screws. 2. Radiographs, 3 views, left wrist.  SURGICAL INDICATIONS:  Ms. Pitstick is a 78 year old female who underwent open reduction and internal fixation of displaced distal radius fracture with a bridge plate construct.  The patient was here for scheduled procedure.  Risks, benefits, and alternatives were discussed in detail with the patient and a signed informed consent was obtained.  Risks include but not limited to bleeding, infection, damage to nearby nerves, arteries, or tendons, loss of motion of the elbow, wrist, and digits, nonunion, malunion, hardware failure, and need for further surgical intervention.  DESCRIPTION OF PROCEDURE:  The patient was properly identified in the preop holding area and a mark with a permanent mark was made on the left wrist to indicate the correct operative site.  The patient was then brought back to the operating room and placed supine where the IV sedation was administered.  The patient received supraclavicular block. The patient tolerated this well.  A well-padded tourniquet was then placed on left brachium and sealed with 1000 drape.  Left upper extremity  was then prepped and draped in a normal sterile fashion.  A time-out was called, the correct side was identified, and procedure was then begun.  Attention was then turned to left wrist.  A longitudinal incision was made through the previous incision.  Dissection was then carried out on the skin and subcutaneous tissue both proximally and distally, and screws were then removed both proximally and distally. Once this was carried out, the plate was able to be removed and the deep implant was able to be removed without difficulty.  The wound was then thoroughly irrigated.  The tourniquet was then deflated.  Final radiographs of the wrist do show the healing distal radius fracture and relatively good position.  Following this, a thorough wound irrigation was done.  The skin was then closed using running 3-0 Prolene suture.  Adaptic dressing, sterile compressive bandage was then applied.  The patient was placed in a short- arm volar splint and taken to the recovery room in good condition.  Intraoperative radiographs, three views of the wrist do show removal of the internal fixation and good position of the radial height, length, and tilt.  POSTOPERATIVE PLAN:  The patient is discharged home and will be seen back in the office in approximately 12 days for wound check, suture removal, and then begin an outpatient therapy regimen working on  her active range of motion, radiographs at the first visit.     Madelynn Done, MD     FWO/MEDQ  D:  10/28/2011  T:  10/29/2011  Job:  [id number]

## 2011-10-29 NOTE — Progress Notes (Signed)
Pt d/c instructions reviewed with pt and her daughter. Both verbalized understanding of all instructions. Copy of instructions given to daughter. Education provided.  No scripts, daughter states MD gave them to her last pm. Pt d/c'd via wheelchair with belongings, escorted by unit NT.

## 2011-10-29 NOTE — Plan of Care (Signed)
Problem: Discharge Progression Outcomes Goal: Barriers To Progression Addressed/Resolved Outcome: Completed/Met Date Met:  10/29/11 Pt able to move left arm well, and fingers, +CMS , see therapy notes

## 2011-10-29 NOTE — Progress Notes (Signed)
Nutrition Brief Note  RD pulled to pt regarding nutrition risk report -- pt triggered for dysphagia per nutrition screen. Pt's daughter reports pt does have a hx of dysphagia but this has essentially resolved. Pt is currently on a Regular diet with good PO intake. No other nutrition problems identified. No nutrition intervention warranted at this time.  Alger Memos, RD Pager #: [phone]

## 2011-10-30 ENCOUNTER — Encounter (HOSPITAL_COMMUNITY): Payer: Self-pay | Admitting: Orthopedic Surgery

## 2012-04-04 IMAGING — CR DG CHEST 1V PORT
1 series · 1 of 1 positions shown · non-contrast
Comparison: Chest radiograph performed earlier today at [DATE] a.m.

CLINICAL DATA: PICC placement.

PORTABLE CHEST - 1 VIEW

[view not recorded]
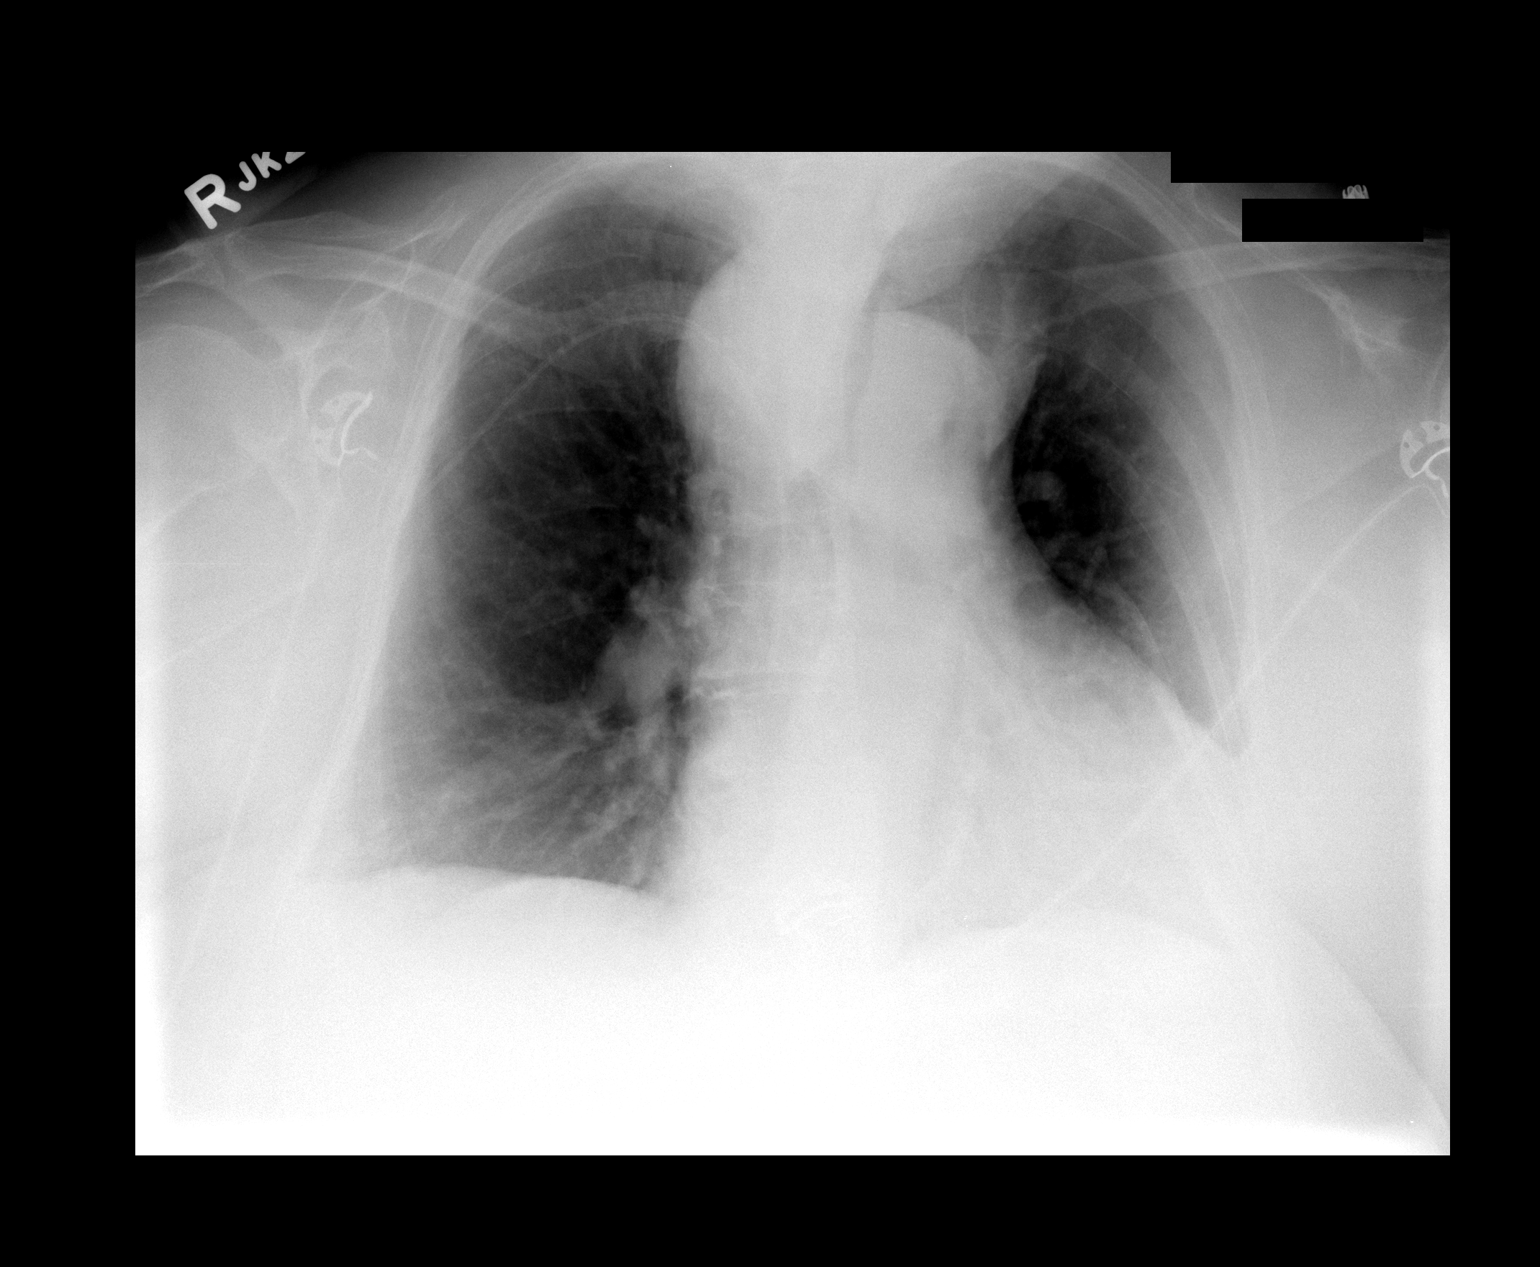

[1 of 1 positions shown; findings below may reference images not displayed]

FINDINGS: The patient's right PICC is noted ending about the distal
SVC.

The lungs are well-aerated.  Vascular congestion is noted; no
pulmonary edema is seen.  There is no evidence of focal
opacification, pleural effusion or pneumothorax.

A large superior mediastinal mass is again noted, with marked mass
effect on the trachea.  The nodular density at the left upper lung
zone is no longer readily characterized, and may have been
artifactual in nature.  The heart is mildly enlarged.  No acute
osseous abnormalities are seen.
IMPRESSION: 1.  Right PICC noted ending about the distal SVC.
2.  Vascular congestion; lungs remain clear.
3.  Large superior mediastinal mass again noted, with marked mass
effect on the trachea.  As before, CT of the chest would be helpful
for further evaluation, when deemed clinically appropriate.

## 2012-04-22 IMAGING — CR DG CHEST 2V
2 series · 2 of 2 positions shown · non-contrast
Comparison: 08/12/2010

CLINICAL DATA: COPD, goiter.

CHEST - 2 VIEW

[w chest pa]
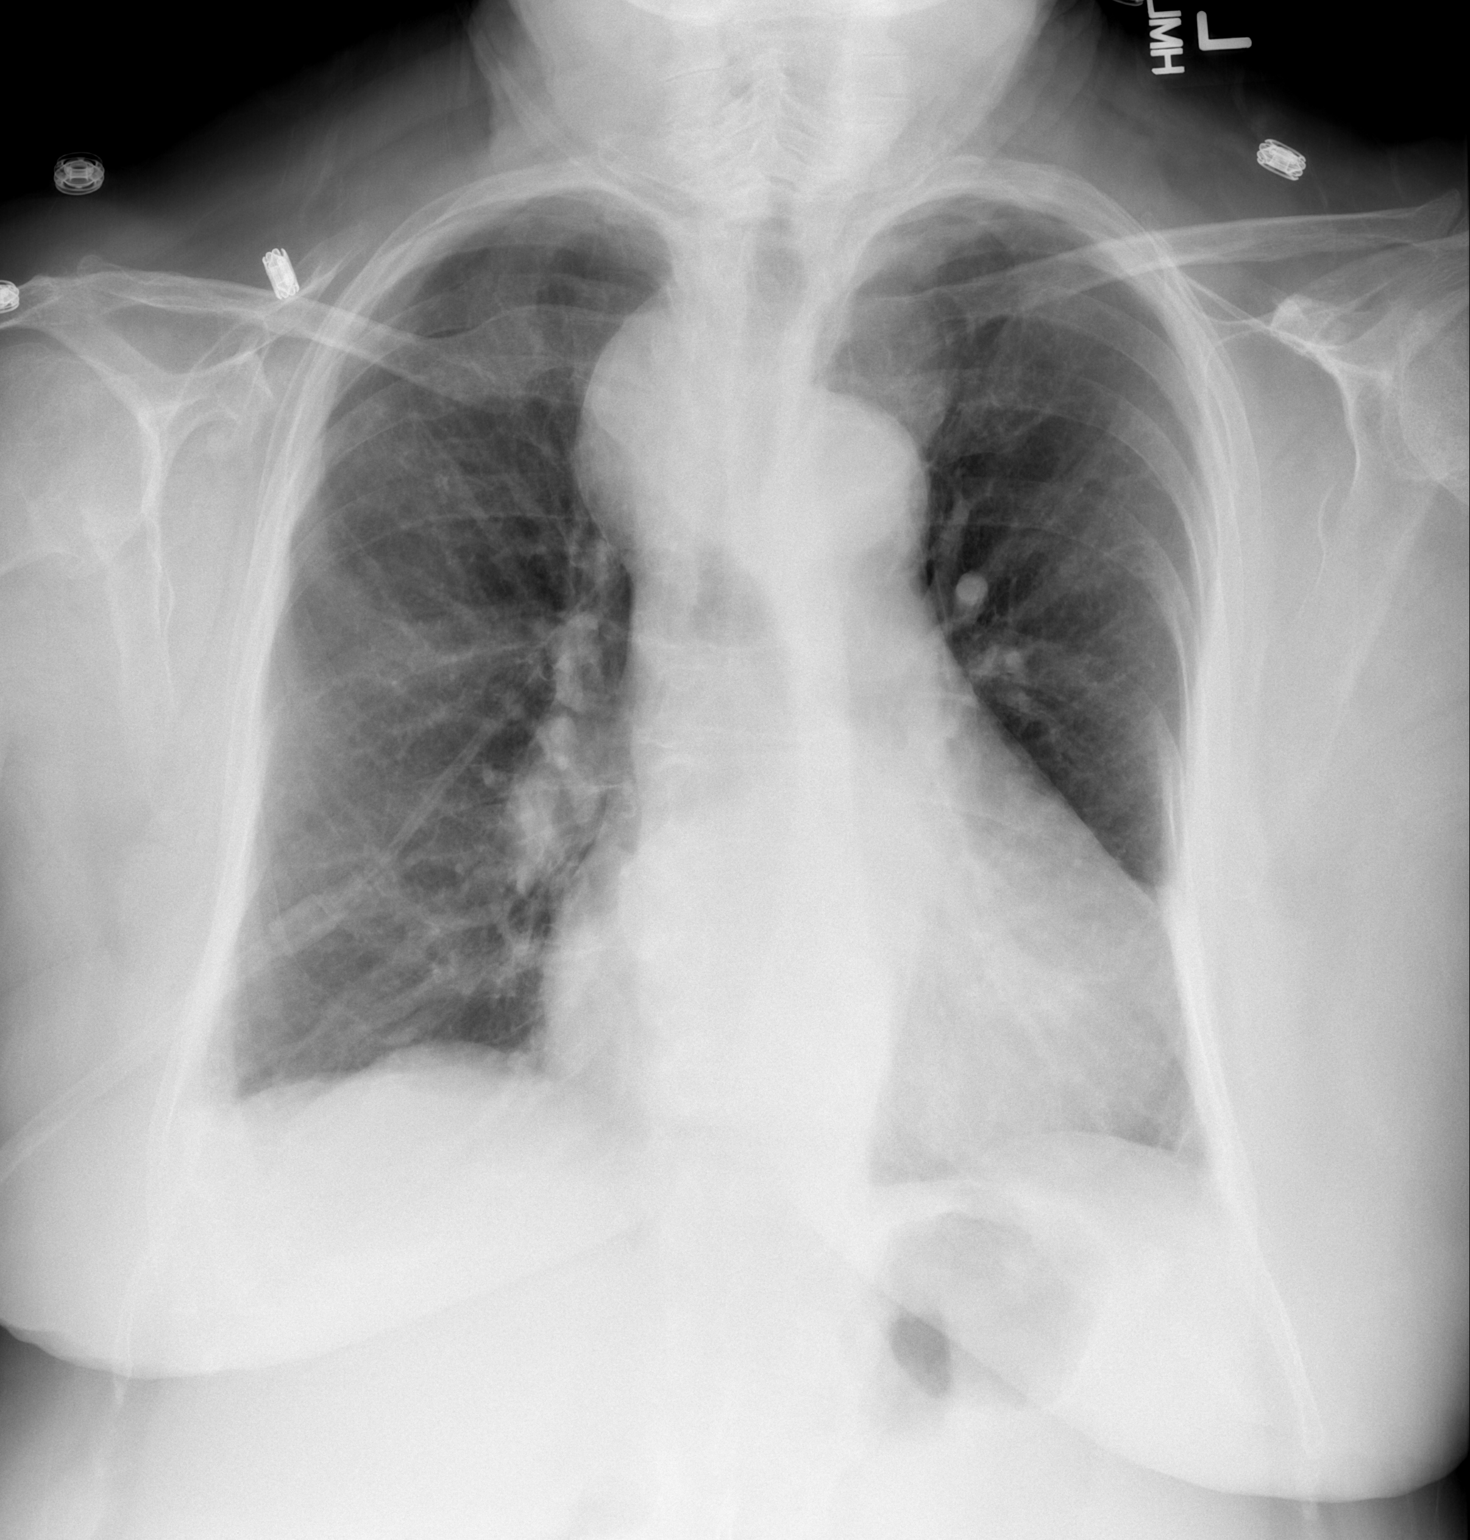

[w chest lat]
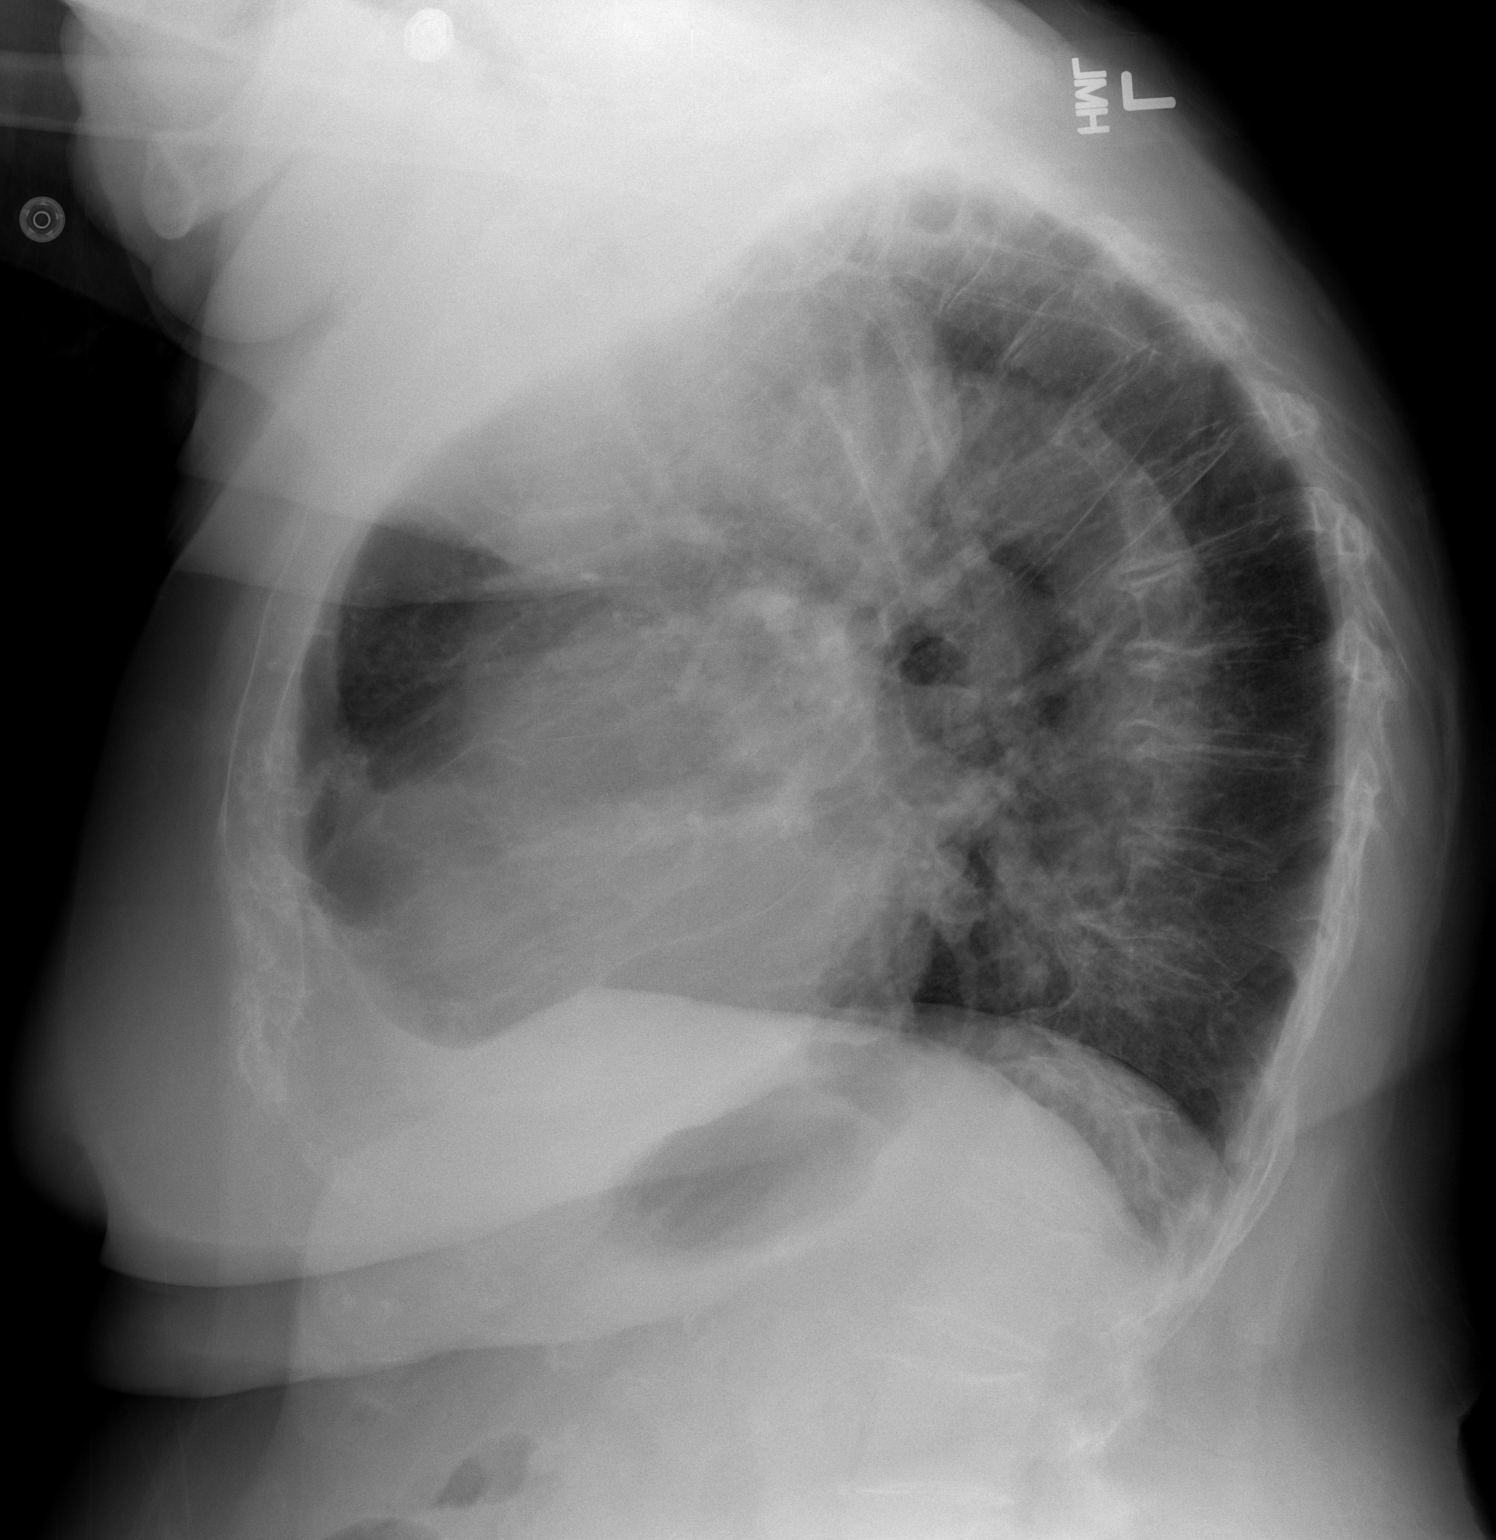

[2 of 2 positions shown; findings below may reference images not displayed]

FINDINGS: Superior mediastinal soft tissue mass is again noted.
Heart is enlarged, stable.  Thoracic aorta is calcified.  Mild
biapical pleural parenchymal scarring.  Question mild pleural
parenchymal scarring at the right costophrenic angle.  Lungs are
otherwise clear.  No pleural fluid.  Degenerative changes are seen
throughout the thoracic spine, which is kyphotic.  Old right
clavicle fracture.
IMPRESSION: 1.  Superior mediastinal mass, which may represent a goiter, as
given in the clinical history.
2.  No acute findings in the chest.

## 2012-04-26 IMAGING — CR DG CHEST 1V PORT
1 series · 1 of 1 positions shown · non-contrast
Comparison: Portable chest x-ray of 09/02/2010

CLINICAL DATA: Postop, follow-up

PORTABLE CHEST - 1 VIEW

[AP]
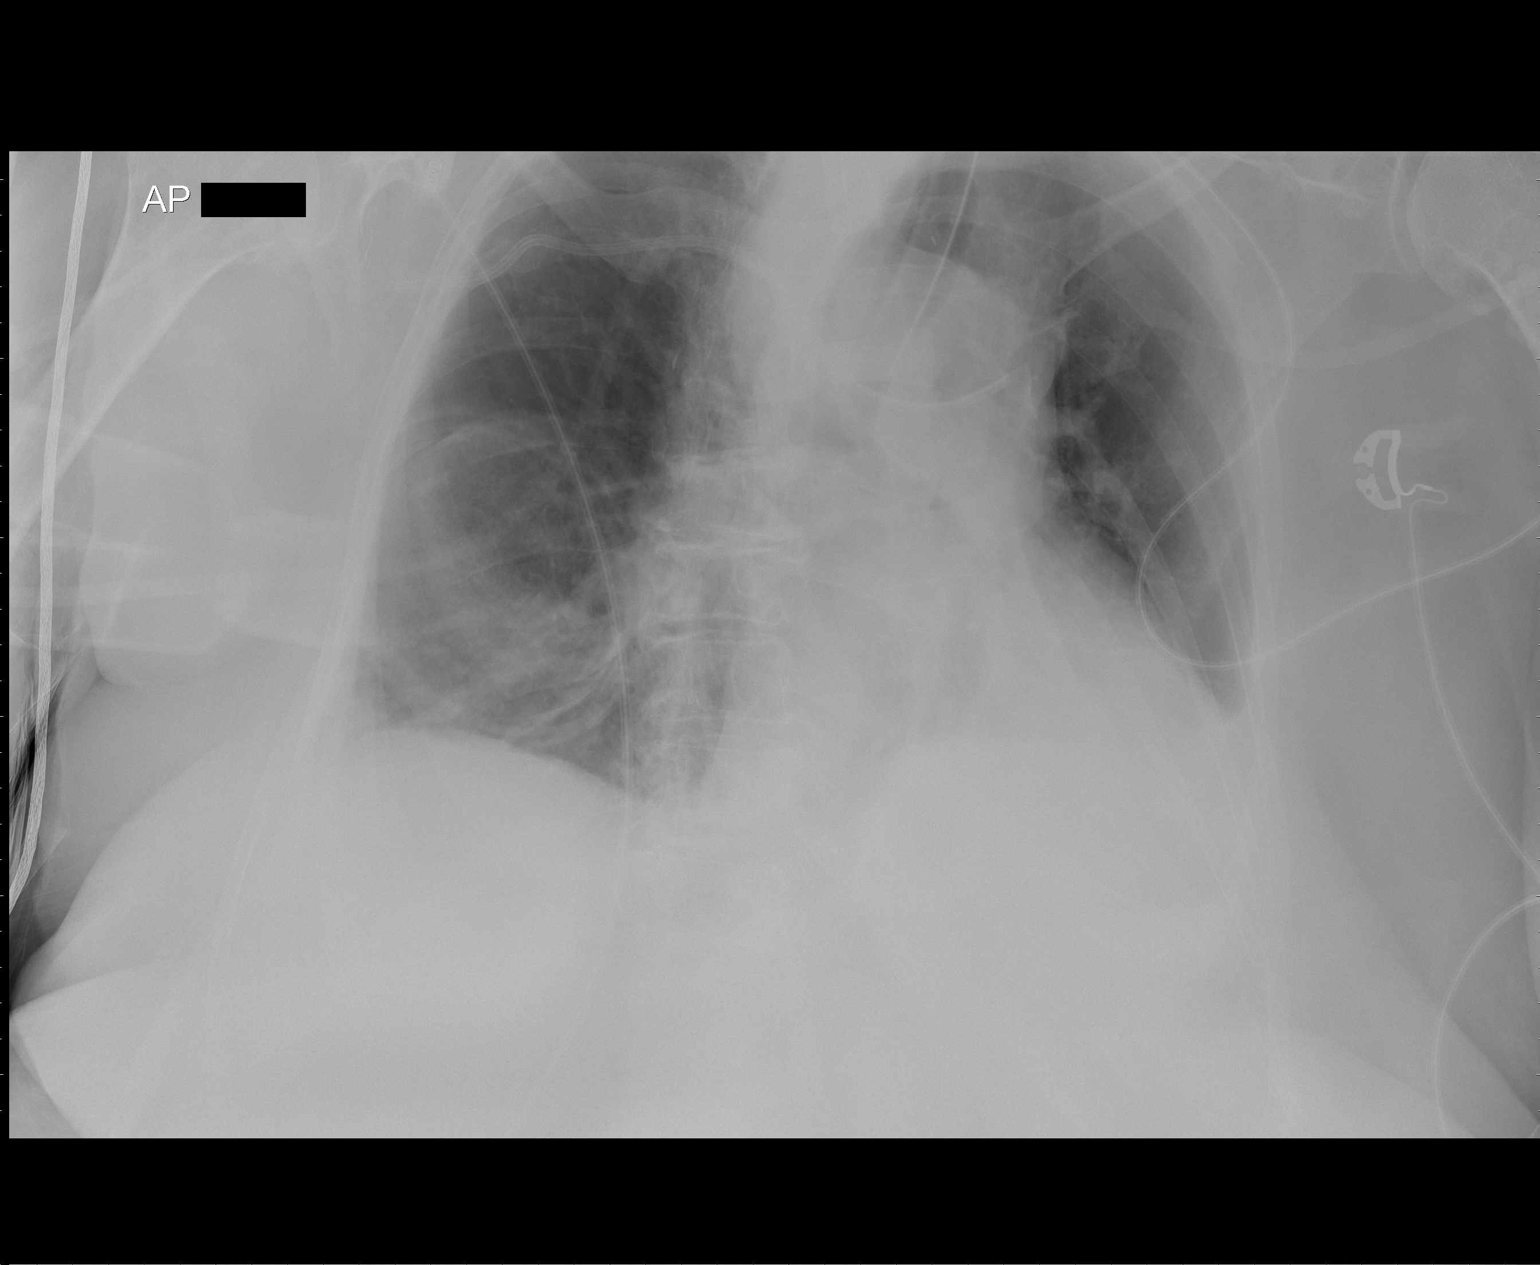

[1 of 1 positions shown; findings below may reference images not displayed]

FINDINGS: There is slight increase in opacity at the left lung base
consistent with atelectasis and effusion with some atelectasis at
the right lung base as well.  Endotracheal tube is unchanged in
position as is the right central venous line.
IMPRESSION: Slight increase in opacity at the left lung base most consistent
with atelectasis and effusion.

## 2012-04-28 IMAGING — CR DG CHEST 1V PORT
1 series · 1 of 1 positions shown · non-contrast
Comparison: 09/04/2010

CLINICAL DATA: COPD.

PORTABLE CHEST - 1 VIEW

[AP]
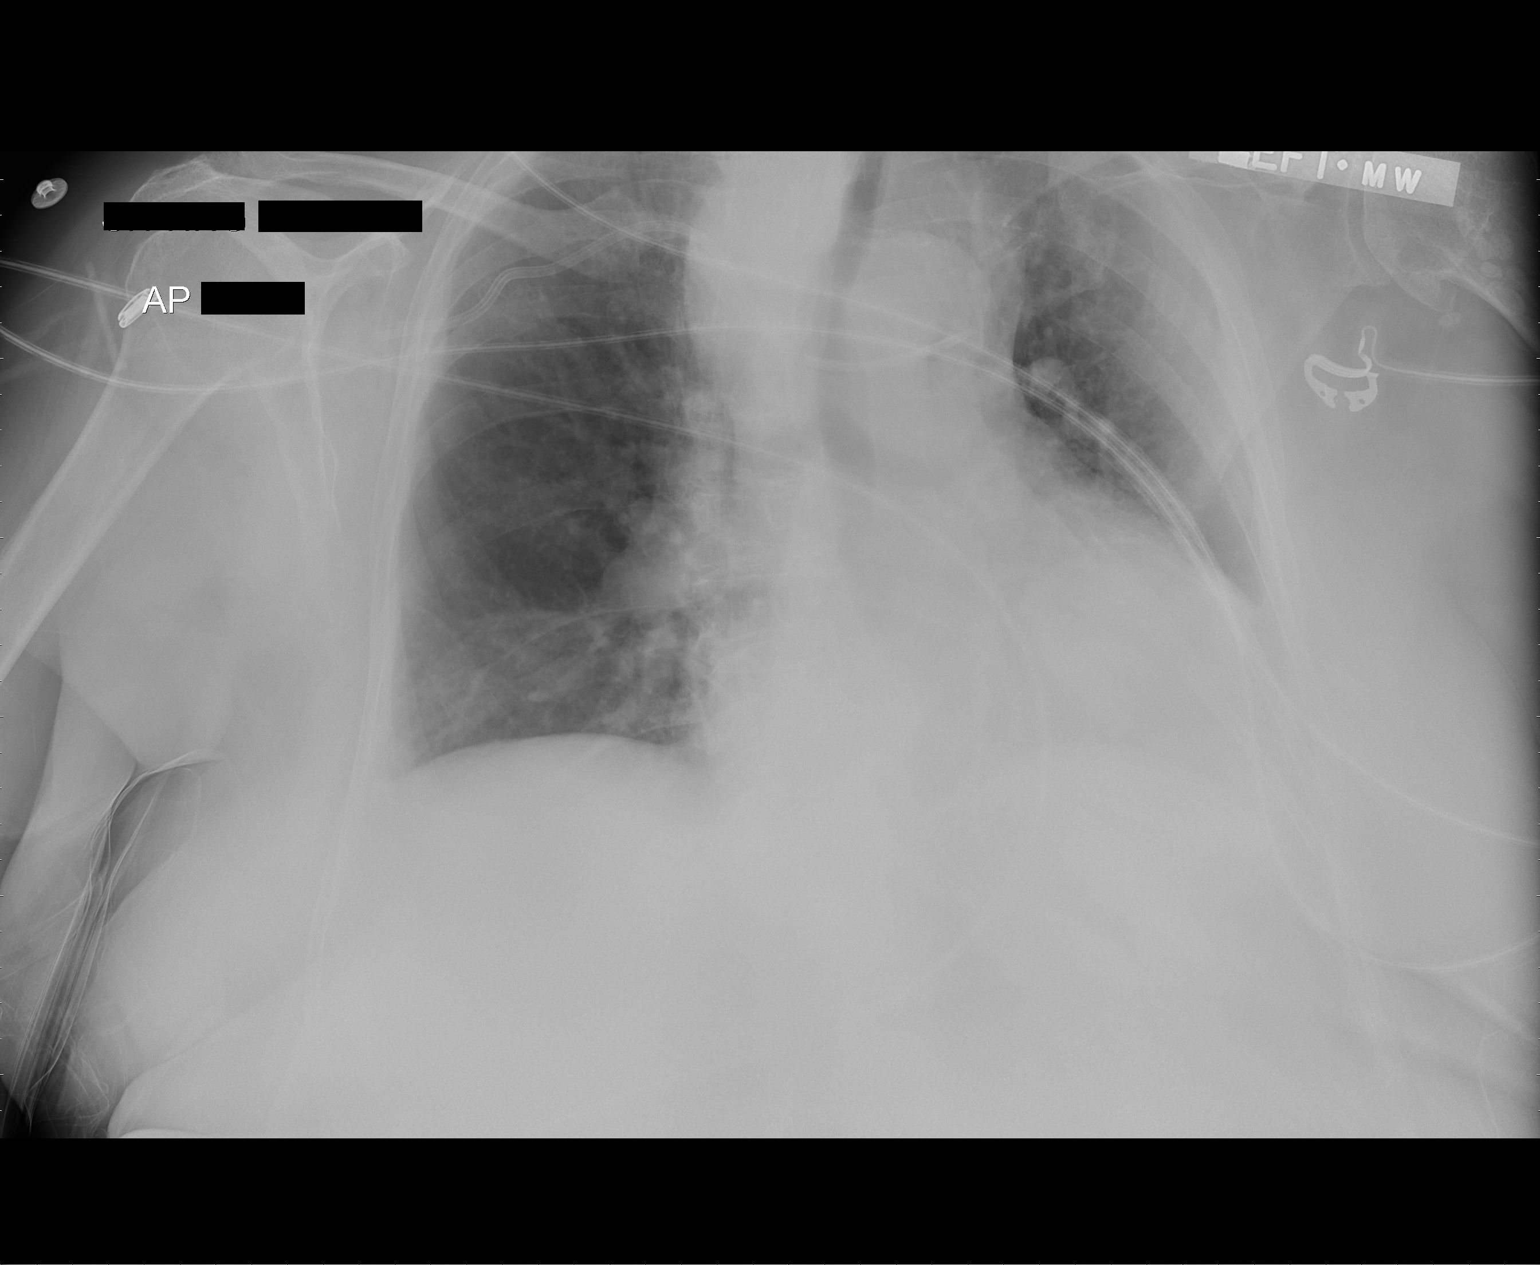

[1 of 1 positions shown; findings below may reference images not displayed]

FINDINGS: 3464 hours. The cardiopericardial silhouette is enlarged.
Right subclavian central line crosses the midline with the tip
positioned in the contralateral (left) innominate vein.
Endotracheal and NG tubes have been removed in the interval. The
cardiopericardial silhouette is enlarged. There is pulmonary
vascular congestion without overt pulmonary edema.  Stable
retrocardiac atelectasis or infiltrate. Telemetry leads overlie the
chest.
IMPRESSION: Interval extubation and NG tube removal.

Right subclavian central line tip remains in the contralateral
innominate vein.

Persistent retrocardiac atelectasis or infiltrate.

## 2013-02-26 DIAGNOSIS — R0602 Shortness of breath: Secondary | ICD-10-CM

## 2013-11-16 ENCOUNTER — Encounter (HOSPITAL_COMMUNITY): Admission: RE | Admit: 2013-11-16 | Payer: Medicare Other | Source: Ambulatory Visit

## 2013-11-20 ENCOUNTER — Encounter (HOSPITAL_COMMUNITY): Payer: Self-pay

## 2013-11-20 ENCOUNTER — Encounter (HOSPITAL_COMMUNITY)
Admission: RE | Admit: 2013-11-20 | Discharge: 2013-11-20 | Disposition: A | Discharge status: Home / Self Care | Payer: Medicare Other | Source: Ambulatory Visit | Attending: Ophthalmology | Admitting: Ophthalmology

## 2013-11-20 ENCOUNTER — Encounter (HOSPITAL_COMMUNITY): Payer: Self-pay | Admitting: Pharmacy Technician

## 2013-11-20 LAB — SURGICAL PCR SCREEN
MRSA, PCR: NEGATIVE
STAPHYLOCOCCUS AUREUS: NEGATIVE

## 2013-11-20 LAB — BASIC METABOLIC PANEL
BUN: 24 mg/dL — ABNORMAL HIGH (ref 6–23)
CALCIUM: 9.6 mg/dL (ref 8.4–10.5)
CO2: 31 mEq/L (ref 19–32)
Chloride: 101 mEq/L (ref 96–112)
Creatinine, Ser: 1.43 mg/dL — ABNORMAL HIGH (ref 0.50–1.10)
GFR calc Af Amer: 38 mL/min — ABNORMAL LOW (ref >90)
GFR, EST NON AFRICAN AMERICAN: 33 mL/min — AB (ref >90)
GLUCOSE: 109 mg/dL — AB (ref 70–99)
Potassium: 5.5 mEq/L — ABNORMAL HIGH (ref 3.7–5.3)
SODIUM: 141 meq/L (ref 137–147)

## 2013-11-20 LAB — HEMOGLOBIN AND HEMATOCRIT, BLOOD
HEMATOCRIT: 43.6 % (ref 36.0–46.0)
Hemoglobin: 13.9 g/dL (ref 12.0–15.0)

## 2013-11-20 NOTE — Patient Instructions (Addendum)
Veronica Mcdonald  11/20/2013   Your procedure is scheduled on:  11/23/13  Report to [facility] at 07:20 AM.  Call this number if you have problems the morning of surgery: [phone]   Remember:   Do not eat food or drink liquids after midnight.   Take these medicines the morning of surgery with A SIP OF WATER: Amlodipine, Lisinopril, Metoprolol, Levothyroxine and Sertraline. Use your Advair inhaler and Duoneb also.   Do not wear jewelry, make-up or nail polish.  Do not wear lotions, powders, or perfumes. You may wear deodorant.  Do not shave 48 hours prior to surgery. Men may shave face and neck.  Do not bring valuables to the hospital.  [facility] is not responsible for any belongings or valuables.               Contacts, dentures or bridgework may not be worn into surgery.  Leave suitcase in the car. After surgery it may be brought to your room.  For patients admitted to the hospital, discharge time is determined by your treatment team.               Patients discharged the day of surgery will not be allowed to drive home.   Special Instructions: Start using your eye drops prior to surgery as directed by your eye doctor.    Please read over the following fact sheets that you were given: Anesthesia Post-op Instructions and Care and Recovery After Surgery     Cataract Surgery  A cataract is a clouding of the lens of the eye. When a lens becomes cloudy, vision is reduced based on the degree and nature of the clouding. Surgery may be needed to improve vision. Surgery removes the cloudy lens and usually replaces it with a substitute lens (intraocular lens, IOL). LET YOUR EYE DOCTOR KNOW ABOUT:  Allergies to food or medicine.  Medicines taken including herbs, eyedrops, over-the-counter medicines, and creams.  Use of steroids (by mouth or creams).  Previous problems with anesthetics or numbing medicine.  History of bleeding problems or blood clots.  Previous surgery.  Other health  problems, including diabetes and kidney problems.  Possibility of pregnancy, if this applies. RISKS AND COMPLICATIONS  Infection.  Inflammation of the eyeball (endophthalmitis) that can spread to both eyes (sympathetic ophthalmia).  Poor wound healing.  If an IOL is inserted, it can later fall out of proper position. This is very uncommon.  Clouding of the part of your eye that holds an IOL in place. This is called an "after-cataract." These are uncommon, but easily treated. BEFORE THE PROCEDURE  Do not eat or drink anything except small amounts of water for 8 to 12 before your surgery, or as directed by your caregiver.  Unless you are told otherwise, continue any eyedrops you have been prescribed.  Talk to your primary caregiver about all other medicines that you take (both prescription and non-prescription). In some cases, you may need to stop or change medicines near the time of your surgery. This is most important if you are taking blood-thinning medicine.Do not stop medicines unless you are told to do so.  Arrange for someone to drive you to and from the procedure.  Do not put contact lenses in either eye on the day of your surgery. PROCEDURE There is more than one method for safely removing a cataract. Your doctor can explain the differences and help determine which is best for you. Phacoemulsification surgery is the most common form of  cataract surgery.  An injection is given behind the eye or eyedrops are given to make this a painless procedure.  A small cut (incision) is made on the edge of the clear, dome-shaped surface that covers the front of the eye (cornea).  A tiny probe is painlessly inserted into the eye. This device gives off ultrasound waves that soften and break up the cloudy center of the lens. This makes it easier for the cloudy lens to be removed by suction.  An IOL may be implanted.  The normal lens of the eye is covered by a clear capsule. Part of that  capsule is intentionally left in the eye to support the IOL.  Your surgeon may or may not use stitches to close the incision. There are other forms of cataract surgery that require a larger incision and stiches to close the eye. This approach is taken in cases where the doctor feels that the cataract cannot be easily removed using phacoemulsification. AFTER THE PROCEDURE  When an IOL is implanted, it does not need care. It becomes a permanent part of your eye and cannot be seen or felt.  Your doctor will schedule follow-up exams to check on your progress.  Review your other medicines with your doctor to see which can be resumed after surgery.  Use eyedrops or take medicine as prescribed by your doctor. Document Released: 08/27/2011 Document Revised: 11/30/2011 Document Reviewed: 08/27/2011 [facility] Patient Information 2014 [employer], [place].    PATIENT INSTRUCTIONS POST-ANESTHESIA  IMMEDIATELY FOLLOWING SURGERY:  Do not drive or operate machinery for the first twenty four hours after surgery.  Do not make any important decisions for twenty four hours after surgery or while taking narcotic pain medications or sedatives.  If you develop intractable nausea and vomiting or a severe headache please notify your doctor immediately.  FOLLOW-UP:  Please make an appointment with your surgeon as instructed. You do not need to follow up with anesthesia unless specifically instructed to do so.  WOUND CARE INSTRUCTIONS (if applicable):  Keep a dry clean dressing on the anesthesia/puncture wound site if there is drainage.  Once the wound has quit draining you may leave it open to air.  Generally you should leave the bandage intact for twenty four hours unless there is drainage.  If the epidural site drains for more than 36-48 hours please call the anesthesia department.  QUESTIONS?:  Please feel free to call your physician or the hospital operator if you have any questions, and they will be happy to assist  you.

## 2013-11-22 MED ORDER — LIDOCAINE HCL 3.5 % OP GEL
OPHTHALMIC | Status: AC
  Filled 2013-11-22: qty 1

## 2013-11-22 MED ORDER — TETRACAINE HCL 0.5 % OP SOLN
OPHTHALMIC | Status: AC
  Filled 2013-11-22: qty 2

## 2013-11-22 MED ORDER — CYCLOPENTOLATE-PHENYLEPHRINE OP SOLN OPTIME - NO CHARGE
OPHTHALMIC | Status: AC
  Filled 2013-11-22: qty 2

## 2013-11-22 MED ORDER — PHENYLEPHRINE HCL 2.5 % OP SOLN
OPHTHALMIC | Status: AC
  Filled 2013-11-22: qty 15

## 2013-11-22 MED ORDER — NEOMYCIN-POLYMYXIN-DEXAMETH 3.5-10000-0.1 OP SUSP
OPHTHALMIC | Status: AC
  Filled 2013-11-22: qty 5

## 2013-11-22 MED ORDER — LIDOCAINE HCL (PF) 1 % IJ SOLN
INTRAMUSCULAR | Status: AC
  Filled 2013-11-22: qty 2

## 2013-11-23 ENCOUNTER — Ambulatory Visit (HOSPITAL_COMMUNITY)
Admission: RE | Admit: 2013-11-23 | Discharge: 2013-11-23 | Disposition: A | Discharge status: Home / Self Care | Payer: Medicare Other | Source: Ambulatory Visit | Attending: Ophthalmology | Admitting: Ophthalmology

## 2013-11-23 ENCOUNTER — Encounter (HOSPITAL_COMMUNITY): Payer: Medicare Other | Admitting: Anesthesiology

## 2013-11-23 ENCOUNTER — Encounter (HOSPITAL_COMMUNITY): Payer: Self-pay | Admitting: *Deleted

## 2013-11-23 ENCOUNTER — Encounter (HOSPITAL_COMMUNITY)
Admission: RE | Disposition: A | Discharge status: Home / Self Care | Payer: Self-pay | Source: Ambulatory Visit | Attending: Ophthalmology

## 2013-11-23 ENCOUNTER — Ambulatory Visit (HOSPITAL_COMMUNITY): Payer: Medicare Other | Admitting: Anesthesiology

## 2013-11-23 DIAGNOSIS — J4489 Other specified chronic obstructive pulmonary disease: Secondary | ICD-10-CM | POA: Insufficient documentation

## 2013-11-23 DIAGNOSIS — IMO0002 Reserved for concepts with insufficient information to code with codable children: Secondary | ICD-10-CM | POA: Insufficient documentation

## 2013-11-23 DIAGNOSIS — Z9981 Dependence on supplemental oxygen: Secondary | ICD-10-CM | POA: Insufficient documentation

## 2013-11-23 DIAGNOSIS — I10 Essential (primary) hypertension: Secondary | ICD-10-CM | POA: Insufficient documentation

## 2013-11-23 DIAGNOSIS — Z01812 Encounter for preprocedural laboratory examination: Secondary | ICD-10-CM | POA: Insufficient documentation

## 2013-11-23 DIAGNOSIS — Z79899 Other long term (current) drug therapy: Secondary | ICD-10-CM | POA: Insufficient documentation

## 2013-11-23 DIAGNOSIS — Z6834 Body mass index (BMI) 34.0-34.9, adult: Secondary | ICD-10-CM | POA: Insufficient documentation

## 2013-11-23 DIAGNOSIS — J449 Chronic obstructive pulmonary disease, unspecified: Secondary | ICD-10-CM | POA: Insufficient documentation

## 2013-11-23 HISTORY — PX: CATARACT EXTRACTION W/PHACO: SHX586

## 2013-11-23 SURGERY — PHACOEMULSIFICATION, CATARACT, WITH IOL INSERTION
Anesthesia: Monitor Anesthesia Care | Site: Eye | Laterality: Left

## 2013-11-23 MED ORDER — BSS IO SOLN
INTRAOCULAR | Status: DC | PRN
  Administered 2013-11-23: 15 mL via INTRAOCULAR

## 2013-11-23 MED ORDER — LACTATED RINGERS IV SOLN
INTRAVENOUS | Status: DC
  Administered 2013-11-23: 08:00:00 via INTRAVENOUS

## 2013-11-23 MED ORDER — LIDOCAINE HCL (PF) 1 % IJ SOLN
INTRAMUSCULAR | Status: DC | PRN
  Administered 2013-11-23: .5 mL

## 2013-11-23 MED ORDER — NA HYALUR & NA CHOND-NA HYALUR 0.55-0.5 ML IO KIT
PACK | INTRAOCULAR | Status: DC | PRN
  Administered 2013-11-23: 1 via OPHTHALMIC

## 2013-11-23 MED ORDER — TRYPAN BLUE 0.06 % OP SOLN
OPHTHALMIC | Status: AC
  Filled 2013-11-23: qty 0.5

## 2013-11-23 MED ORDER — BSS IO SOLN
INTRAOCULAR | Status: DC | PRN
  Administered 2013-11-23: 09:00:00

## 2013-11-23 MED ORDER — EPINEPHRINE HCL 1 MG/ML IJ SOLN
INTRAMUSCULAR | Status: AC
  Filled 2013-11-23: qty 1

## 2013-11-23 MED ORDER — TETRACAINE HCL 0.5 % OP SOLN
1.0000 [drp] | OPHTHALMIC | Status: AC
  Administered 2013-11-23 (×3): 1 [drp] via OPHTHALMIC

## 2013-11-23 MED ORDER — LIDOCAINE HCL 3.5 % OP GEL
1.0000 "application " | Freq: Once | OPHTHALMIC | Status: AC
  Administered 2013-11-23: 1 via OPHTHALMIC

## 2013-11-23 MED ORDER — POVIDONE-IODINE 5 % OP SOLN
OPHTHALMIC | Status: DC | PRN
  Administered 2013-11-23: 1 via OPHTHALMIC

## 2013-11-23 MED ORDER — MIDAZOLAM HCL 2 MG/2ML IJ SOLN
1.0000 mg | INTRAMUSCULAR | Status: DC | PRN
  Administered 2013-11-23: 2 mg via INTRAVENOUS
  Filled 2013-11-23: qty 2

## 2013-11-23 MED ORDER — PHENYLEPHRINE HCL 2.5 % OP SOLN
1.0000 [drp] | OPHTHALMIC | Status: AC
  Administered 2013-11-23 (×3): 1 [drp] via OPHTHALMIC

## 2013-11-23 MED ORDER — TRYPAN BLUE 0.06 % OP SOLN
OPHTHALMIC | Status: DC | PRN
  Administered 2013-11-23: .25 mL via INTRAOCULAR

## 2013-11-23 MED ORDER — NEOMYCIN-POLYMYXIN-DEXAMETH 3.5-10000-0.1 OP SUSP
OPHTHALMIC | Status: DC | PRN
  Administered 2013-11-23: 2 [drp] via OPHTHALMIC

## 2013-11-23 MED ORDER — CYCLOPENTOLATE-PHENYLEPHRINE 0.2-1 % OP SOLN
1.0000 [drp] | OPHTHALMIC | Status: AC
  Administered 2013-11-23 (×3): 1 [drp] via OPHTHALMIC

## 2013-11-23 SURGICAL SUPPLY — 33 items
CAPSULAR TENSION RING-AMO (OPHTHALMIC RELATED) IMPLANT
CLOTH BEACON ORANGE TIMEOUT ST (SAFETY) ×3 IMPLANT
EYE SHIELD UNIVERSAL CLEAR (GAUZE/BANDAGES/DRESSINGS) ×3 IMPLANT
GLOVE BIO SURGEON STRL SZ 6.5 (GLOVE) IMPLANT
GLOVE BIO SURGEONS STRL SZ 6.5 (GLOVE)
GLOVE BIOGEL PI IND STRL 6.5 (GLOVE) IMPLANT
GLOVE BIOGEL PI IND STRL 7.0 (GLOVE) ×1 IMPLANT
GLOVE BIOGEL PI IND STRL 7.5 (GLOVE) IMPLANT
GLOVE BIOGEL PI INDICATOR 6.5 (GLOVE)
GLOVE BIOGEL PI INDICATOR 7.0 (GLOVE) ×2
GLOVE BIOGEL PI INDICATOR 7.5 (GLOVE)
GLOVE ECLIPSE 6.5 STRL STRAW (GLOVE) IMPLANT
GLOVE ECLIPSE 7.0 STRL STRAW (GLOVE) IMPLANT
GLOVE ECLIPSE 7.5 STRL STRAW (GLOVE) IMPLANT
GLOVE EXAM NITRILE LRG STRL (GLOVE) IMPLANT
GLOVE EXAM NITRILE MD LF STRL (GLOVE) ×3 IMPLANT
GLOVE SKINSENSE NS SZ6.5 (GLOVE)
GLOVE SKINSENSE NS SZ7.0 (GLOVE)
GLOVE SKINSENSE STRL SZ6.5 (GLOVE) IMPLANT
GLOVE SKINSENSE STRL SZ7.0 (GLOVE) IMPLANT
KIT VITRECTOMY (OPHTHALMIC RELATED) IMPLANT
PAD ARMBOARD 7.5X6 YLW CONV (MISCELLANEOUS) ×3 IMPLANT
PROC W NO LENS (INTRAOCULAR LENS)
PROC W SPEC LENS (INTRAOCULAR LENS)
PROCESS W NO LENS (INTRAOCULAR LENS) IMPLANT
PROCESS W SPEC LENS (INTRAOCULAR LENS) IMPLANT
RING MALYGIN (MISCELLANEOUS) IMPLANT
SIGHTPATH CAT PROC W REG LENS (Ophthalmic Related) ×3 IMPLANT
SYR TB 1ML LL NO SAFETY (SYRINGE) ×3 IMPLANT
TAPE SURG TRANSPORE 1 IN (GAUZE/BANDAGES/DRESSINGS) ×1 IMPLANT
TAPE SURGICAL TRANSPORE 1 IN (GAUZE/BANDAGES/DRESSINGS) ×2
VISCOELASTIC ADDITIONAL (OPHTHALMIC RELATED) IMPLANT
WATER STERILE IRR 250ML POUR (IV SOLUTION) ×3 IMPLANT

## 2013-11-23 NOTE — Anesthesia Preprocedure Evaluation (Signed)
Anesthesia Evaluation  Patient identified by MRN, date of birth, ID band Patient awake    Reviewed: Allergy & Precautions, H&P , NPO status , Patient's Chart, lab work & pertinent test results, reviewed documented beta blocker date and time , Unable to perform ROS - Chart review only  History of Anesthesia Complications Negative for: history of anesthetic complications  Airway Mallampati: II TM Distance: >3 FB     Dental  (+) Edentulous Upper, Edentulous Lower   Pulmonary shortness of breath, with exertion and Long-Term Oxygen Therapy, COPD oxygen dependent,  breath sounds clear to auscultation  Pulmonary exam normal       Cardiovascular hypertension, Pt. on medications +CHF Rhythm:Regular Rate:Normal     Neuro/Psych TIA   GI/Hepatic negative GI ROS, Neg liver ROS,   Endo/Other  Hypothyroidism Morbid obesity  Renal/GU Renal InsufficiencyRenal diseasenegative Renal ROS     Musculoskeletal   Abdominal   Peds  Hematology   Anesthesia Other Findings   Reproductive/Obstetrics                           Anesthesia Physical Anesthesia Plan  ASA: III  Anesthesia Plan: MAC   Post-op Pain Management:    Induction: Intravenous  Airway Management Planned: Nasal Cannula  Additional Equipment:   Intra-op Plan:   Post-operative Plan:   Informed Consent: I have reviewed the patients History and Physical, chart, labs and discussed the procedure including the risks, benefits and alternatives for the proposed anesthesia with the patient or authorized representative who has indicated his/her understanding and acceptance.     Plan Discussed with:   Anesthesia Plan Comments:         Anesthesia Quick Evaluation  

## 2013-11-23 NOTE — Op Note (Signed)
Date of Admission: 11/23/2013  Date of Surgery: 11/23/2013  Pre-Op Dx: Cataract  Left  Eye  Post-Op Dx: Mature Cataract Left Eye,  Dx Code [phone] 5309366.17  Surgeon: Gemma PayorKerry Harvest Stanco, M.D.  Assistants: None  Anesthesia: Topical with MAC  Indications: Painless, progressive loss of vision with compromise of daily activities.  Surgery: Cataract Extraction with Intraocular lens Implant Left Eye  Discription: The patient had dilating drops and viscous lidocaine placed into the Left eye in the pre-op holding area. After transfer to the operating room, a time out was performed. The patient was then prepped and draped. Beginning with a 75 degree blade a paracentesis port was made at the surgeon's 2 o'clock position. The anterior chamber was then filled with 1% non-preserved lidocaine. The anterior chamber was then filled with Vision Blue to stain the anterior capsule. The Vision Blue was displaced from the anterior chamber with BSS. This was followed by filling the anterior chamber with Viscoat. A 2.354mm keratome blade was used to make a clear corneal incision at the temporal limbus. A bent cystatome needle was used to create a continuous tear capsulotomy. Hydrodissection was performed with balanced salt solution on a Fine canula. The lens nucleus was then removed using the phacoemulsification handpiece. Residual cortex was removed with the I&A handpiece. The anterior chamber and capsular bag were refilled with Provisc. A posterior chamber intraocular lens was placed into the capsular bag with it's injector. The implant was positioned with the Kuglan hook. The Provisc was then removed from the anterior chamber and capsular bag with the I&A handpiece. Stromal hydration of the main incision and paracentesis port was performed with BSS on a Fine canula. The wounds were tested for leak which was negative. The patient tolerated the procedure well. There were no operative complications. The patient was then transferred to the  recovery room in stable condition.  Complications: None  Specimen: None  EBL: None  Prosthetic device: B&L enVista, MX60, power 24.5D, SN [id number].

## 2013-11-23 NOTE — Transfer of Care (Signed)
Immediate Anesthesia Transfer of Care Note  Patient: Veronica Mcdonald  Procedure(s) Performed: Procedure(s) with comments: CATARACT EXTRACTION PHACO AND INTRAOCULAR LENS PLACEMENT LEFT EYE (Left) - CDE:53.84  Patient Location: Short Stay  Anesthesia Type:MAC  Level of Consciousness: awake, alert , oriented and patient cooperative  Airway & Oxygen Therapy: Patient Spontanous Breathing and Patient connected to nasal cannula oxygen  Post-op Assessment: Report given to PACU RN, Post -op Vital signs reviewed and stable and Patient moving all extremities  Post vital signs: Reviewed and stable  Complications: No apparent anesthesia complications

## 2013-11-23 NOTE — Discharge Instructions (Signed)

## 2013-11-23 NOTE — H&P (Signed)
I have reviewed the H&P, the patient was re-examined, and I have identified no interval changes in medical condition and plan of care since the history and physical of record  

## 2013-11-23 NOTE — Anesthesia Postprocedure Evaluation (Signed)
  Anesthesia Post-op Note  Patient: Veronica Mcdonald  Procedure(s) Performed: Procedure(s) with comments: CATARACT EXTRACTION PHACO AND INTRAOCULAR LENS PLACEMENT LEFT EYE (Left) - CDE:53.84  Patient Location: Short Stay  Anesthesia Type:MAC  Level of Consciousness: awake, alert , oriented and patient cooperative  Airway and Oxygen Therapy: Patient Spontanous Breathing and Patient connected to nasal cannula oxygen  Post-op Pain: none  Post-op Assessment: Post-op Vital signs reviewed, Patient's Cardiovascular Status Stable, Respiratory Function Stable, Patent Airway and Pain level controlled  Post-op Vital Signs: Reviewed and stable  Complications: No apparent anesthesia complications

## 2013-11-24 ENCOUNTER — Encounter (HOSPITAL_COMMUNITY): Payer: Self-pay | Admitting: Ophthalmology

## 2014-01-02 ENCOUNTER — Encounter (HOSPITAL_COMMUNITY): Payer: Self-pay | Admitting: Pharmacy Technician

## 2014-01-08 MED ORDER — ONDANSETRON HCL 4 MG/2ML IJ SOLN: 4.0000 mg | Freq: Once | INTRAMUSCULAR | Status: AC | PRN

## 2014-01-08 MED ORDER — FENTANYL CITRATE 0.05 MG/ML IJ SOLN: 25.0000 ug | INTRAMUSCULAR | Status: DC | PRN

## 2014-01-09 ENCOUNTER — Encounter (HOSPITAL_COMMUNITY)
Admission: RE | Admit: 2014-01-09 | Discharge: 2014-01-09 | Disposition: A | Discharge status: Home / Self Care | Payer: Medicare Other | Source: Ambulatory Visit | Attending: Ophthalmology | Admitting: Ophthalmology

## 2014-01-09 ENCOUNTER — Encounter (HOSPITAL_COMMUNITY): Payer: Self-pay

## 2014-01-12 MED ORDER — PHENYLEPHRINE HCL 2.5 % OP SOLN
OPHTHALMIC | Status: AC
  Filled 2014-01-12: qty 15

## 2014-01-12 MED ORDER — LIDOCAINE HCL (PF) 1 % IJ SOLN
INTRAMUSCULAR | Status: AC
  Filled 2014-01-12: qty 2

## 2014-01-12 MED ORDER — LIDOCAINE HCL 3.5 % OP GEL
OPHTHALMIC | Status: AC
  Filled 2014-01-12: qty 1

## 2014-01-12 MED ORDER — CYCLOPENTOLATE-PHENYLEPHRINE OP SOLN OPTIME - NO CHARGE
OPHTHALMIC | Status: AC
  Filled 2014-01-12: qty 2

## 2014-01-12 MED ORDER — TETRACAINE HCL 0.5 % OP SOLN
OPHTHALMIC | Status: AC
  Filled 2014-01-12: qty 2

## 2014-01-12 MED ORDER — NEOMYCIN-POLYMYXIN-DEXAMETH 3.5-10000-0.1 OP SUSP
OPHTHALMIC | Status: AC
  Filled 2014-01-12: qty 5

## 2014-01-15 ENCOUNTER — Other Ambulatory Visit: Payer: Self-pay

## 2014-01-15 ENCOUNTER — Encounter (HOSPITAL_COMMUNITY): Payer: Medicare Other | Admitting: Anesthesiology

## 2014-01-15 ENCOUNTER — Ambulatory Visit (HOSPITAL_COMMUNITY): Payer: Medicare Other | Admitting: Anesthesiology

## 2014-01-15 ENCOUNTER — Encounter (HOSPITAL_COMMUNITY): Payer: Self-pay | Admitting: *Deleted

## 2014-01-15 ENCOUNTER — Ambulatory Visit (HOSPITAL_COMMUNITY)
Admission: RE | Admit: 2014-01-15 | Discharge: 2014-01-15 | Disposition: A | Discharge status: Home / Self Care | Payer: Medicare Other | Source: Ambulatory Visit | Attending: Ophthalmology | Admitting: Ophthalmology

## 2014-01-15 ENCOUNTER — Encounter (HOSPITAL_COMMUNITY)
Admission: RE | Disposition: A | Discharge status: Home / Self Care | Payer: Self-pay | Source: Ambulatory Visit | Attending: Ophthalmology

## 2014-01-15 DIAGNOSIS — J4489 Other specified chronic obstructive pulmonary disease: Secondary | ICD-10-CM | POA: Insufficient documentation

## 2014-01-15 DIAGNOSIS — J449 Chronic obstructive pulmonary disease, unspecified: Secondary | ICD-10-CM | POA: Insufficient documentation

## 2014-01-15 DIAGNOSIS — I10 Essential (primary) hypertension: Secondary | ICD-10-CM | POA: Insufficient documentation

## 2014-01-15 DIAGNOSIS — H2589 Other age-related cataract: Secondary | ICD-10-CM | POA: Insufficient documentation

## 2014-01-15 DIAGNOSIS — E669 Obesity, unspecified: Secondary | ICD-10-CM | POA: Insufficient documentation

## 2014-01-15 DIAGNOSIS — Z79899 Other long term (current) drug therapy: Secondary | ICD-10-CM | POA: Insufficient documentation

## 2014-01-15 DIAGNOSIS — Z9981 Dependence on supplemental oxygen: Secondary | ICD-10-CM | POA: Insufficient documentation

## 2014-01-15 HISTORY — PX: CATARACT EXTRACTION W/PHACO: SHX586

## 2014-01-15 HISTORY — DX: Sleep apnea, unspecified: G47.30

## 2014-01-15 SURGERY — PHACOEMULSIFICATION, CATARACT, WITH IOL INSERTION
Anesthesia: Monitor Anesthesia Care | Site: Eye | Laterality: Right

## 2014-01-15 MED ORDER — PHENYLEPHRINE HCL 2.5 % OP SOLN
1.0000 [drp] | OPHTHALMIC | Status: AC
  Administered 2014-01-15 (×3): 1 [drp] via OPHTHALMIC

## 2014-01-15 MED ORDER — BSS IO SOLN
INTRAOCULAR | Status: DC | PRN
  Administered 2014-01-15: 15 mL via INTRAOCULAR

## 2014-01-15 MED ORDER — LIDOCAINE 3.5 % OP GEL OPTIME - NO CHARGE
OPHTHALMIC | Status: DC | PRN
  Administered 2014-01-15: 2 [drp] via OPHTHALMIC

## 2014-01-15 MED ORDER — LIDOCAINE HCL 3.5 % OP GEL
1.0000 "application " | Freq: Once | OPHTHALMIC | Status: AC
  Administered 2014-01-15: 1 via OPHTHALMIC

## 2014-01-15 MED ORDER — TETRACAINE HCL 0.5 % OP SOLN
1.0000 [drp] | OPHTHALMIC | Status: AC
  Administered 2014-01-15 (×3): 1 [drp] via OPHTHALMIC

## 2014-01-15 MED ORDER — POVIDONE-IODINE 5 % OP SOLN
OPHTHALMIC | Status: DC | PRN
  Administered 2014-01-15: 1 via OPHTHALMIC

## 2014-01-15 MED ORDER — FENTANYL CITRATE 0.05 MG/ML IJ SOLN
25.0000 ug | INTRAMUSCULAR | Status: AC
  Administered 2014-01-15: 25 ug via INTRAVENOUS
  Filled 2014-01-15: qty 2

## 2014-01-15 MED ORDER — MIDAZOLAM HCL 2 MG/2ML IJ SOLN
1.0000 mg | INTRAMUSCULAR | Status: DC | PRN
  Administered 2014-01-15: 2 mg via INTRAVENOUS
  Filled 2014-01-15: qty 2

## 2014-01-15 MED ORDER — PROVISC 10 MG/ML IO SOLN
INTRAOCULAR | Status: DC | PRN
  Administered 2014-01-15: 0.85 mL via INTRAOCULAR

## 2014-01-15 MED ORDER — ONDANSETRON HCL 4 MG/2ML IJ SOLN: 4.0000 mg | Freq: Once | INTRAMUSCULAR | Status: DC | PRN

## 2014-01-15 MED ORDER — LIDOCAINE HCL (PF) 1 % IJ SOLN
INTRAMUSCULAR | Status: DC | PRN
  Administered 2014-01-15: .4 mL

## 2014-01-15 MED ORDER — EPINEPHRINE HCL 1 MG/ML IJ SOLN
INTRAOCULAR | Status: DC | PRN
  Administered 2014-01-15: 08:00:00

## 2014-01-15 MED ORDER — CYCLOPENTOLATE-PHENYLEPHRINE 0.2-1 % OP SOLN
1.0000 [drp] | OPHTHALMIC | Status: AC
  Administered 2014-01-15 (×3): 1 [drp] via OPHTHALMIC

## 2014-01-15 MED ORDER — NEOMYCIN-POLYMYXIN-DEXAMETH 3.5-10000-0.1 OP SUSP
OPHTHALMIC | Status: DC | PRN
  Administered 2014-01-15: 2 [drp] via OPHTHALMIC

## 2014-01-15 MED ORDER — FENTANYL CITRATE 0.05 MG/ML IJ SOLN: 25.0000 ug | INTRAMUSCULAR | Status: DC | PRN

## 2014-01-15 MED ORDER — LACTATED RINGERS IV SOLN
INTRAVENOUS | Status: DC
  Administered 2014-01-15: 08:00:00 via INTRAVENOUS

## 2014-01-15 SURGICAL SUPPLY — 34 items
CAPSULAR TENSION RING-AMO (OPHTHALMIC RELATED) IMPLANT
CLOTH BEACON ORANGE TIMEOUT ST (SAFETY) ×3 IMPLANT
EYE SHIELD UNIVERSAL CLEAR (GAUZE/BANDAGES/DRESSINGS) ×3 IMPLANT
GLOVE BIO SURGEON STRL SZ 6.5 (GLOVE) IMPLANT
GLOVE BIO SURGEONS STRL SZ 6.5 (GLOVE)
GLOVE BIOGEL PI IND STRL 6.5 (GLOVE) IMPLANT
GLOVE BIOGEL PI IND STRL 7.0 (GLOVE) ×1 IMPLANT
GLOVE BIOGEL PI IND STRL 7.5 (GLOVE) IMPLANT
GLOVE BIOGEL PI INDICATOR 6.5 (GLOVE)
GLOVE BIOGEL PI INDICATOR 7.0 (GLOVE) ×2
GLOVE BIOGEL PI INDICATOR 7.5 (GLOVE)
GLOVE ECLIPSE 6.5 STRL STRAW (GLOVE) IMPLANT
GLOVE ECLIPSE 7.0 STRL STRAW (GLOVE) IMPLANT
GLOVE ECLIPSE 7.5 STRL STRAW (GLOVE) IMPLANT
GLOVE EXAM NITRILE LRG STRL (GLOVE) IMPLANT
GLOVE EXAM NITRILE MD LF STRL (GLOVE) IMPLANT
GLOVE SKINSENSE NS SZ6.5 (GLOVE)
GLOVE SKINSENSE NS SZ7.0 (GLOVE)
GLOVE SKINSENSE STRL SZ6.5 (GLOVE) IMPLANT
GLOVE SKINSENSE STRL SZ7.0 (GLOVE) IMPLANT
GLOVE SS N UNI LF 7.0 STRL (GLOVE) ×3 IMPLANT
KIT VITRECTOMY (OPHTHALMIC RELATED) IMPLANT
PAD ARMBOARD 7.5X6 YLW CONV (MISCELLANEOUS) ×3 IMPLANT
PROC W NO LENS (INTRAOCULAR LENS)
PROC W SPEC LENS (INTRAOCULAR LENS)
PROCESS W NO LENS (INTRAOCULAR LENS) IMPLANT
PROCESS W SPEC LENS (INTRAOCULAR LENS) IMPLANT
RING MALYGIN (MISCELLANEOUS) IMPLANT
SIGHTPATH CAT PROC W REG LENS (Ophthalmic Related) ×3 IMPLANT
SYR TB 1ML LL NO SAFETY (SYRINGE) IMPLANT
TAPE SURG TRANSPORE 1 IN (GAUZE/BANDAGES/DRESSINGS) ×1 IMPLANT
TAPE SURGICAL TRANSPORE 1 IN (GAUZE/BANDAGES/DRESSINGS) ×2
VISCOELASTIC ADDITIONAL (OPHTHALMIC RELATED) IMPLANT
WATER STERILE IRR 250ML POUR (IV SOLUTION) ×3 IMPLANT

## 2014-01-15 NOTE — Transfer of Care (Signed)
Immediate Anesthesia Transfer of Care Note  Patient: Veronica Mcdonald  Procedure(s) Performed: Procedure(s) (LRB): CATARACT EXTRACTION PHACO AND INTRAOCULAR LENS PLACEMENT (IOC) (Right)  Patient Location: Shortstay  Anesthesia Type: MAC  Level of Consciousness: awake  Airway & Oxygen Therapy: Patient Spontanous Breathing   Post-op Assessment: Report given to PACU RN, Post -op Vital signs reviewed and stable and Patient moving all extremities  Post vital signs: Reviewed and stable  Complications: No apparent anesthesia complications

## 2014-01-15 NOTE — Op Note (Signed)
Date of Admission: 01/15/2014  Date of Surgery: 01/15/2014  Pre-Op Dx: Cataract Right  Eye  Post-Op Dx: Combined Cataract  Right  Eye,  Dx Code 366.19  Surgeon: Gemma PayorKerry Kimbrely Buckel, M.D.  Assistants: None  Anesthesia: Topical with MAC  Indications: Painless, progressive loss of vision with compromise of daily activities.  Surgery: Cataract Extraction with Intraocular lens Implant Right Eye  Discription: The patient had dilating drops and viscous lidocaine placed into the Right eye in the pre-op holding area. After transfer to the operating room, a time out was performed. The patient was then prepped and draped. Beginning with a 75 degree blade a paracentesis port was made at the surgeon's 2 o'clock position. The anterior chamber was then filled with 1% non-preserved lidocaine. This was followed by filling the anterior chamber with Provisc.  A 2.484mm keratome blade was used to make a clear corneal incision at the temporal limbus.  A bent cystatome needle was used to create a continuous tear capsulotomy. Hydrodissection was performed with balanced salt solution on a Fine canula. The lens nucleus was then removed using the phacoemulsification handpiece. Residual cortex was removed with the I&A handpiece. The anterior chamber and capsular bag were refilled with Provisc. A posterior chamber intraocular lens was placed into the capsular bag with it's injector. The implant was positioned with the Kuglan hook. The Provisc was then removed from the anterior chamber and capsular bag with the I&A handpiece. Stromal hydration of the main incision and paracentesis port was performed with BSS on a Fine canula. The wounds were tested for leak which was negative. The patient tolerated the procedure well. There were no operative complications. The patient was then transferred to the recovery room in stable condition.  Complications: None  Specimen: None  EBL: None  Prosthetic device: B&L enVista, MX60, power 24.0D, SN  [id number].

## 2014-01-15 NOTE — H&P (Signed)
I have reviewed the H&P, the patient was re-examined, and I have identified no interval changes in medical condition and plan of care since the history and physical of record  

## 2014-01-15 NOTE — Discharge Instructions (Signed)

## 2014-01-15 NOTE — Progress Notes (Signed)
01/15/14 0732  OBSTRUCTIVE SLEEP APNEA  Have you ever been diagnosed with sleep apnea through a sleep study? No  Do you snore loudly (loud enough to be heard through closed doors)?  1  Do you often feel tired, fatigued, or sleepy during the daytime? 1  Has anyone observed you stop breathing during your sleep? 0  Do you have, or are you being treated for high blood pressure? 1  BMI more than 35 kg/m2? 0  Age over 78 years old? 1  Neck circumference greater than 40 cm/16 inches? 0  Gender: 0  Obstructive Sleep Apnea Score 4

## 2014-01-15 NOTE — Anesthesia Postprocedure Evaluation (Signed)
  Anesthesia Post-op Note  Patient: Veronica Mcdonald  Procedure(s) Performed: Procedure(s) (LRB): CATARACT EXTRACTION PHACO AND INTRAOCULAR LENS PLACEMENT (IOC) (Right)  Patient Location:  Short Stay  Anesthesia Type: MAC  Level of Consciousness: awake  Airway and Oxygen Therapy: Patient Spontanous Breathing  Post-op Pain: none  Post-op Assessment: Post-op Vital signs reviewed, Patient's Cardiovascular Status Stable, Respiratory Function Stable, Patent Airway, No signs of Nausea or vomiting and Pain level controlled  Post-op Vital Signs: Reviewed and stable  Complications: No apparent anesthesia complications

## 2014-01-15 NOTE — Anesthesia Preprocedure Evaluation (Signed)
Anesthesia Evaluation  Patient identified by MRN, date of birth, ID band Patient awake    Reviewed: Allergy & Precautions, H&P , NPO status , Patient's Chart, lab work & pertinent test results, reviewed documented beta blocker date and time , Unable to perform ROS - Chart review only  History of Anesthesia Complications Negative for: history of anesthetic complications  Airway Mallampati: II TM Distance: >3 FB     Dental  (+) Edentulous Upper, Edentulous Lower   Pulmonary shortness of breath, with exertion and Long-Term Oxygen Therapy, COPD oxygen dependent,  breath sounds clear to auscultation  Pulmonary exam normal       Cardiovascular hypertension, Pt. on medications +CHF Rhythm:Regular Rate:Normal     Neuro/Psych TIA   GI/Hepatic negative GI ROS, Neg liver ROS,   Endo/Other  Hypothyroidism Morbid obesity  Renal/GU Renal InsufficiencyRenal diseasenegative Renal ROS     Musculoskeletal   Abdominal   Peds  Hematology   Anesthesia Other Findings   Reproductive/Obstetrics                           Anesthesia Physical Anesthesia Plan  ASA: III  Anesthesia Plan: MAC   Post-op Pain Management:    Induction: Intravenous  Airway Management Planned: Nasal Cannula  Additional Equipment:   Intra-op Plan:   Post-operative Plan:   Informed Consent: I have reviewed the patients History and Physical, chart, labs and discussed the procedure including the risks, benefits and alternatives for the proposed anesthesia with the patient or authorized representative who has indicated his/her understanding and acceptance.     Plan Discussed with:   Anesthesia Plan Comments:         Anesthesia Quick Evaluation

## 2014-01-15 NOTE — Anesthesia Procedure Notes (Signed)
Procedure Name: MAC Date/Time: 01/15/2014 8:20 AM Performed by: Franco NonesYATES, Milah Recht S Pre-anesthesia Checklist: Patient identified, Emergency Drugs available, Suction available, Timeout performed and Patient being monitored Patient Re-evaluated:Patient Re-evaluated prior to inductionOxygen Delivery Method: Nasal Cannula

## 2014-01-16 ENCOUNTER — Encounter (HOSPITAL_COMMUNITY): Payer: Self-pay | Admitting: Ophthalmology

## 2016-05-10 ENCOUNTER — Emergency Department (HOSPITAL_COMMUNITY)
Admission: EM | Admit: 2016-05-10 | Discharge: 2016-05-11 | Disposition: A | Discharge status: Home / Self Care | Payer: Medicare Other | Attending: Emergency Medicine | Admitting: Emergency Medicine

## 2016-05-10 ENCOUNTER — Encounter (HOSPITAL_COMMUNITY): Payer: Self-pay | Admitting: Emergency Medicine

## 2016-05-10 DIAGNOSIS — R443 Hallucinations, unspecified: Secondary | ICD-10-CM | POA: Diagnosis not present

## 2016-05-10 DIAGNOSIS — J449 Chronic obstructive pulmonary disease, unspecified: Secondary | ICD-10-CM | POA: Diagnosis not present

## 2016-05-10 DIAGNOSIS — N189 Chronic kidney disease, unspecified: Secondary | ICD-10-CM | POA: Insufficient documentation

## 2016-05-10 DIAGNOSIS — Z7984 Long term (current) use of oral hypoglycemic drugs: Secondary | ICD-10-CM | POA: Insufficient documentation

## 2016-05-10 DIAGNOSIS — I509 Heart failure, unspecified: Secondary | ICD-10-CM | POA: Insufficient documentation

## 2016-05-10 DIAGNOSIS — E039 Hypothyroidism, unspecified: Secondary | ICD-10-CM | POA: Diagnosis not present

## 2016-05-10 DIAGNOSIS — Z8673 Personal history of transient ischemic attack (TIA), and cerebral infarction without residual deficits: Secondary | ICD-10-CM | POA: Insufficient documentation

## 2016-05-10 DIAGNOSIS — R410 Disorientation, unspecified: Secondary | ICD-10-CM | POA: Diagnosis present

## 2016-05-10 DIAGNOSIS — I130 Hypertensive heart and chronic kidney disease with heart failure and stage 1 through stage 4 chronic kidney disease, or unspecified chronic kidney disease: Secondary | ICD-10-CM | POA: Diagnosis not present

## 2016-05-10 DIAGNOSIS — Z79899 Other long term (current) drug therapy: Secondary | ICD-10-CM | POA: Insufficient documentation

## 2016-05-10 LAB — BASIC METABOLIC PANEL
ANION GAP: 6 (ref 5–15)
BUN: 14 mg/dL (ref 6–20)
CO2: 37 mmol/L — AB (ref 22–32)
Calcium: 8.8 mg/dL — ABNORMAL LOW (ref 8.9–10.3)
Chloride: 101 mmol/L (ref 101–111)
Creatinine, Ser: 0.74 mg/dL (ref 0.44–1.00)
GLUCOSE: 90 mg/dL (ref 65–99)
POTASSIUM: 4.4 mmol/L (ref 3.5–5.1)
Sodium: 144 mmol/L (ref 135–145)

## 2016-05-10 LAB — CBC WITH DIFFERENTIAL/PLATELET
BASOS ABS: 0 10*3/uL (ref 0.0–0.1)
BASOS PCT: 0 %
Eosinophils Absolute: 0.1 10*3/uL (ref 0.0–0.7)
Eosinophils Relative: 1 %
HEMATOCRIT: 38.9 % (ref 36.0–46.0)
Hemoglobin: 11.6 g/dL — ABNORMAL LOW (ref 12.0–15.0)
Lymphocytes Relative: 18 %
Lymphs Abs: 1.6 10*3/uL (ref 0.7–4.0)
MCH: 28.5 pg (ref 26.0–34.0)
MCHC: 29.8 g/dL — ABNORMAL LOW (ref 30.0–36.0)
MCV: 95.6 fL (ref 78.0–100.0)
Monocytes Absolute: 0.6 10*3/uL (ref 0.1–1.0)
Monocytes Relative: 7 %
NEUTROS ABS: 6.3 10*3/uL (ref 1.7–7.7)
Neutrophils Relative %: 74 %
Platelets: 120 10*3/uL — ABNORMAL LOW (ref 150–400)
RBC: 4.07 MIL/uL (ref 3.87–5.11)
RDW: 13.9 % (ref 11.5–15.5)
WBC: 8.6 10*3/uL (ref 4.0–10.5)

## 2016-05-10 LAB — RAPID URINE DRUG SCREEN, HOSP PERFORMED
Amphetamines: NOT DETECTED
BARBITURATES: NOT DETECTED
BENZODIAZEPINES: NOT DETECTED
Cocaine: NOT DETECTED
OPIATES: POSITIVE — AB
Tetrahydrocannabinol: NOT DETECTED

## 2016-05-10 LAB — URINALYSIS, ROUTINE W REFLEX MICROSCOPIC
Bilirubin Urine: NEGATIVE
GLUCOSE, UA: NEGATIVE mg/dL
HGB URINE DIPSTICK: NEGATIVE
Ketones, ur: NEGATIVE mg/dL
Nitrite: POSITIVE — AB
PROTEIN: NEGATIVE mg/dL
Specific Gravity, Urine: 1.015 (ref 1.005–1.030)
pH: 7.5 (ref 5.0–8.0)

## 2016-05-10 LAB — ETHANOL: Alcohol, Ethyl (B): 5 mg/dL — ABNORMAL HIGH (ref <5)

## 2016-05-10 LAB — URINE MICROSCOPIC-ADD ON

## 2016-05-10 MED ORDER — HYDROCODONE-ACETAMINOPHEN 5-325 MG PO TABS
2.0000 | ORAL_TABLET | Freq: Once | ORAL | Status: AC
  Administered 2016-05-10: 2 via ORAL
  Filled 2016-05-10: qty 2

## 2016-05-10 MED ORDER — LEVOFLOXACIN 500 MG PO TABS
500.0000 mg | ORAL_TABLET | Freq: Every day | ORAL | Status: DC
  Administered 2016-05-10: 500 mg via ORAL
  Filled 2016-05-10: qty 1

## 2016-05-10 MED ORDER — RISPERIDONE 0.5 MG PO TABS: 0.5000 mg | ORAL_TABLET | Freq: Every day | ORAL | 0 refills | Status: AC

## 2016-05-10 MED ORDER — CIPROFLOXACIN HCL 500 MG PO TABS: 500.0000 mg | ORAL_TABLET | Freq: Two times a day (BID) | ORAL | 0 refills | Status: AC

## 2016-05-10 MED ORDER — RISPERIDONE 0.5 MG PO TABS
0.5000 mg | ORAL_TABLET | Freq: Every day | ORAL | Status: DC
  Administered 2016-05-11: 0.5 mg via ORAL
  Filled 2016-05-10: qty 1

## 2016-05-10 MED ORDER — HYDROCODONE-ACETAMINOPHEN 5-325 MG PO TABS
1.0000 | ORAL_TABLET | Freq: Once | ORAL | Status: AC
  Administered 2016-05-10: 1 via ORAL
  Filled 2016-05-10: qty 1

## 2016-05-10 NOTE — ED Notes (Signed)
Pt c/o left leg pain .  Per family pt normally takes hydrocodone for pain.  Dr. Estell HarpinZammit notified and orders received.

## 2016-05-10 NOTE — ED Notes (Signed)
Received call from TTS extender wanting pt's meds reconciled d/t question whether pt was on Remeron. Per family, pt was started on it but they took her off it so pt is not taking Remeron and therefore medication reconciliation that was done on admission is correct.

## 2016-05-10 NOTE — Discharge Instructions (Signed)
Follow up with your family md next week. °

## 2016-05-10 NOTE — ED Provider Notes (Signed)
[facility] Provider Note   CSN: [id number] Arrival date & time: 05/10/16  1552     History   Chief Complaint Chief Complaint  Patient presents with  . Other    Involuntary Commitment    HPI Veronica Mcdonald is a 82 y.o. female.  Patient has been having delusional ideas program month. She's been on some medicine is not helping    Altered Mental Status   This is a chronic problem. The problem has not changed since onset.Associated symptoms include confusion. Pertinent negatives include no seizures and no hallucinations. Risk factors include the patient not taking medications correctly. Her past medical history does not include seizures.    Past Medical History:  Diagnosis Date  . Blood transfusion   . CHF (congestive heart failure) (HCC)    h/o  . Chronic kidney disease    h/o renal insufficiency  . COPD (chronic obstructive pulmonary disease) (HCC)    2L of O2 via [place] continusouly  . Hypertension   . Hypothyroidism   . Oxygen dependent    2.5L/min  . Sleep apnea    Stop Bang score of 4  . TIA (transient ischemic attack)     Patient Active Problem List   Diagnosis Date Noted  . Hypertension 08/15/2011    Past Surgical History:  Procedure Laterality Date  . CATARACT EXTRACTION W/PHACO Left 11/23/2013   Procedure: CATARACT EXTRACTION PHACO AND INTRAOCULAR LENS PLACEMENT LEFT EYE;  Surgeon: Gemma PayorKerry Hunt, MD;  Location: [facility];  Service: Ophthalmology;  Laterality: Left;  CDE:53.84  . CATARACT EXTRACTION W/PHACO Right 01/15/2014   Procedure: CATARACT EXTRACTION PHACO AND INTRAOCULAR LENS PLACEMENT (IOC);  Surgeon: Gemma PayorKerry Hunt, MD;  Location: [facility];  Service: Ophthalmology;  Laterality: Right;  CDE 15.41  . FRACTURE SURGERY    . HARDWARE REMOVAL  10/28/2011   Procedure: HARDWARE REMOVAL;  Surgeon: Sharma CovertFred W Ortmann, MD;  [facility];  Service: Orthopedics;  Laterality: Left;  . JOINT REPLACEMENT     hip, bilateral knee, L knee has rod from hip to foot (immobile)  .  KNEE FUSION     left  . ORIF WRIST FRACTURE  08/15/2011   Procedure: OPEN REDUCTION INTERNAL FIXATION (ORIF) WRIST FRACTURE;  Surgeon: Sharma CovertFred W Ortmann;  Location: [facility] OR;  Service: Orthopedics;  Laterality: Left;  . THYROIDECTOMY  03/2011    OB History    No data available       Home Medications    Prior to Admission medications   Medication Sig Start Date End Date Taking? Authorizing Provider  acyclovir (ZOVIRAX) 800 MG tablet Take 800 mg by mouth 5 (five) times daily.   Yes Historical Provider, MD  amLODipine (NORVASC) 5 MG tablet Take 5 mg by mouth daily.    Yes Historical Provider, MD  Fluticasone-Salmeterol (ADVAIR) 250-50 MCG/DOSE AEPB Inhale 1 puff into the lungs every 12 (twelve) hours.     Yes Historical Provider, MD  HYDROcodone-acetaminophen (NORCO) 10-325 MG tablet Take 1 tablet by mouth every 4 (four) hours as needed.   Yes Historical Provider, MD  levothyroxine (SYNTHROID, LEVOTHROID) 100 MCG tablet Take 100 mcg by mouth daily.    Yes Historical Provider, MD  lisinopril (PRINIVIL,ZESTRIL) 20 MG tablet Take 20 mg by mouth daily.     Yes Historical Provider, MD  metFORMIN (GLUCOPHAGE) 500 MG tablet Take 500 mg by mouth daily.   Yes Historical Provider, MD  metoprolol tartrate (LOPRESSOR) 25 MG tablet Take 25 mg by mouth 2 (two) times daily.  Yes Historical Provider, MD  sertraline (ZOLOFT) 100 MG tablet Take 100 mg by mouth daily.    Yes Historical Provider, MD  ciprofloxacin (CIPRO) 500 MG tablet Take 1 tablet (500 mg total) by mouth 2 (two) times daily. One po bid x 7 days 05/10/16   Bethann BerkshireJoseph Melisssa Donner, MD  ipratropium-albuterol (DUONEB) 0.5-2.5 (3) MG/3ML SOLN Take 3 mLs by nebulization every 6 (six) hours as needed (for shortness of breath).    Historical Provider, MD  risperiDONE (RISPERDAL) 0.5 MG tablet Take 1 tablet (0.5 mg total) by mouth at bedtime. 05/10/16   Bethann BerkshireJoseph Mikia Delaluz, MD    Family History No family history on file.  Social History Social History  Substance Use  Topics  . Smoking status: Never Smoker  . Smokeless tobacco: Not on file  . Alcohol use No     Allergies   Bactrim [sulfamethoxazole-trimethoprim]; Cephalosporins; Penicillins; and Vancomycin   Review of Systems Review of Systems  Constitutional: Negative for appetite change and fatigue.  HENT: Negative for congestion, ear discharge and sinus pressure.   Eyes: Negative for discharge.  Respiratory: Negative for cough.   Cardiovascular: Negative for chest pain.  Gastrointestinal: Negative for abdominal pain and diarrhea.  Genitourinary: Negative for frequency and hematuria.  Musculoskeletal: Negative for back pain.  Skin: Negative for rash.  Neurological: Negative for seizures and headaches.  Psychiatric/Behavioral: Positive for confusion. Negative for hallucinations.     Physical Exam Updated Vital Signs BP 147/92   Pulse 72   Temp 98.4 F (36.9 C) (Oral)   Resp 17   Ht 5' (1.524 m)   Wt 189 lb (85.7 kg)   SpO2 96%   BMI 36.91 kg/m   Physical Exam  Constitutional: She is oriented to person, place, and time. She appears well-developed.  HENT:  Head: Normocephalic.  Eyes: Conjunctivae and EOM are normal. No scleral icterus.  Neck: Neck supple. No thyromegaly present.  Cardiovascular: Normal rate and regular rhythm.  Exam reveals no gallop and no friction rub.   No murmur heard. Pulmonary/Chest: No stridor. She has no wheezes. She has no rales. She exhibits no tenderness.  Abdominal: She exhibits no distension. There is no tenderness. There is no rebound.  Musculoskeletal: Normal range of motion. She exhibits no edema.  Lymphadenopathy:    She has no cervical adenopathy.  Neurological: She is oriented to person, place, and time. She exhibits normal muscle tone. Coordination normal.  Skin: No rash noted. No erythema.  Psychiatric:  Patient having delusions about family members     ED Treatments / Results  Labs (all labs ordered are listed, but only abnormal  results are displayed) Labs Reviewed  CBC WITH DIFFERENTIAL/PLATELET - Abnormal; Notable for the following:       Result Value   Hemoglobin 11.6 (*)    MCHC 29.8 (*)    Platelets 120 (*)    All other components within normal limits  BASIC METABOLIC PANEL - Abnormal; Notable for the following:    CO2 37 (*)    Calcium 8.8 (*)    All other components within normal limits  ETHANOL - Abnormal; Notable for the following:    Alcohol, Ethyl (B) 5 (*)    All other components within normal limits  URINE RAPID DRUG SCREEN, HOSP PERFORMED - Abnormal; Notable for the following:    Opiates POSITIVE (*)    All other components within normal limits  URINALYSIS, ROUTINE W REFLEX MICROSCOPIC (NOT AT [facility]) - Abnormal; Notable for the following:  Nitrite POSITIVE (*)    Leukocytes, UA SMALL (*)    All other components within normal limits  URINE MICROSCOPIC-ADD ON - Abnormal; Notable for the following:    Squamous Epithelial / LPF 0-5 (*)    Bacteria, UA MANY (*)    All other components within normal limits  URINE CULTURE    EKG  EKG Interpretation None       Radiology No results found.  Procedures Procedures (including critical care time)  Medications Ordered in ED Medications  levofloxacin (LEVAQUIN) tablet 500 mg (500 mg Oral Given 05/10/16 2043)  risperiDONE (RISPERDAL) tablet 0.5 mg (not administered)  HYDROcodone-acetaminophen (NORCO/VICODIN) 5-325 MG per tablet 1 tablet (1 tablet Oral Given 05/10/16 1819)  HYDROcodone-acetaminophen (NORCO/VICODIN) 5-325 MG per tablet 2 tablet (2 tablets Oral Given 05/10/16 2256)     Initial Impression / Assessment and Plan / ED Course  I have reviewed the triage vital signs and the nursing notes.  Pertinent labs & imaging results that were available during my care of the patient were reviewed by me and considered in my medical decision making (see chart for details).  Clinical Course    Patient has a urinary tract infection that will be  treated with Keflex. Her delusions will be treated with Risperdal. This is what is recommended by psychiatry  Final Clinical Impressions(s) / ED Diagnoses   Final diagnoses:  Hallucinations    New Prescriptions New Prescriptions   CIPROFLOXACIN (CIPRO) 500 MG TABLET    Take 1 tablet (500 mg total) by mouth 2 (two) times daily. One po bid x 7 days   RISPERIDONE (RISPERDAL) 0.5 MG TABLET    Take 1 tablet (0.5 mg total) by mouth at bedtime.     Bethann Berkshire, MD 05/10/16 2356

## 2016-05-10 NOTE — [facility] Counselor (Signed)
Clinician called and spoke to Ginger, RN for a medication reconciliation for pt, per Alberteen SamFran Hobson, NP.   Gwinda Passereylese D Bennett, MS, [facility], [facility] Triage Specialist [phone]

## 2016-05-10 NOTE — Consult Note (Signed)
Spoke with patient's son who states the patient has been having auditory hallucinations for about three months. She was seen by her PCP, Dr. Sherryll BurgerShah, who started the patient on Remeron 15 mg once daily on July 28th for the hallucinations. Family member states the hallucinations did not improve and Remeron was increased to 30 mg once daily on August 8th. Family member states there was no improvement in hallucinations, rather the hallucinations became worse. PCP ordered family to stop Remeron; the patient has been off Remeron for approximately 2 weeks.   Recommendations for this patient are as follows: -Continue home medications as previously ordered -Start Risperdal 0.5 mg PO QHS for hallucinations and delusions -Follow up with up with outpatient provider, Dr. Sherryll BurgerShah.  Alberteen SamFran Janeene Sand, FNP-BC [facility] 05/10/2016       11:10PM

## 2016-05-10 NOTE — BH Assessment (Addendum)
Tele Assessment Note   Veronica Mcdonald is an 82 y.o. female, presented involuntarily and accompanied by her son [name]) and grandson. Pt's son reported his mother has been having auditory hallucinations for about three months. Pt's son reported his mother is very hard of hearing. Pt reported there is an intercom system installed in her house. Pt's son reported his mother thinks the intercom system feeds from her house to her daughters house. Pt's son reported his mother told him his daughter ran off to [place], her husband was cursing her, her daughter has boyfriend named "Dance movement psychotherapist", five people was trying to get her, her daugther was dead, he (the son) fell of the roof. Pt's son reported these delusion ae not true.  Pt's son reported he asked his sister (pt's daughter) to come over so she (pt) can see she is fine however his mother delusions continues once her daughter leaves. Pt's son reported his mother has mashed the life line and the sheriff has responded. Pt's son reported his mother has been up all night with her delusions. Pt denied SI, HI and self-harming behaviors. Pt reported experiencing the following symptoms: irritability.   Pt is currently living alone as reported by her son his mother has a CNA that comes in during the day and a relative stay at night. Pt denies verbal, physical and sexual abuse. Pt denies inpatient and rehabilitation admissions.   Pt was dressed in scrubs, alert, oriented x4 with soft, high-pitched speech. Eye contact was good. Mood was appropriate to circumstances. Judgement, impulse control are poor. Thought process is coherent and relevant. Pt was cooperative throughout assessment.   Diagnosis: F22 Delusional Disorder  Past Medical History:  Past Medical History:  Diagnosis Date  . Blood transfusion   . CHF (congestive heart failure) ([facility])    h/o  . Chronic kidney disease    h/o renal insufficiency  . COPD (chronic obstructive pulmonary disease) ([facility])    2L of  O2 via [place] continusouly  . Hypertension   . Hypothyroidism   . Oxygen dependent    2.5L/min  . Sleep apnea    Stop Bang score of 4  . TIA (transient ischemic attack)     Past Surgical History:  Procedure Laterality Date  . CATARACT EXTRACTION W/PHACO Left 11/23/2013   Procedure: CATARACT EXTRACTION PHACO AND INTRAOCULAR LENS PLACEMENT LEFT EYE;  Surgeon: Tonny Branch, MD;  Location: [facility];  Service: Ophthalmology;  Laterality: Left;  CDE:53.84  . CATARACT EXTRACTION W/PHACO Right 01/15/2014   Procedure: CATARACT EXTRACTION PHACO AND INTRAOCULAR LENS PLACEMENT (IOC);  Surgeon: Tonny Branch, MD;  Location: [facility];  Service: Ophthalmology;  Laterality: Right;  CDE 15.41  . FRACTURE SURGERY    . HARDWARE REMOVAL  10/28/2011   Procedure: HARDWARE REMOVAL;  Surgeon: Linna Hoff, MD;  Location: [facility];  Service: Orthopedics;  Laterality: Left;  . JOINT REPLACEMENT     hip, bilateral knee, L knee has rod from hip to foot (immobile)  . KNEE FUSION     left  . ORIF WRIST FRACTURE  08/15/2011   Procedure: OPEN REDUCTION INTERNAL FIXATION (ORIF) WRIST FRACTURE;  Surgeon: Linna Hoff;  Location: [facility];  Service: Orthopedics;  Laterality: Left;  . THYROIDECTOMY  03/2011    Family History: No family history on file.  Social History:  reports that she has never smoked. She does not have any smokeless tobacco history on file. She reports that she does not drink alcohol or use drugs.  Additional  Social History:  Alcohol / Drug Use Pain Medications: Pt denies.  Prescriptions: Pt denies.  Over the Counter: Pt denies.  History of alcohol / drug use?: No history of alcohol / drug abuse Longest period of sobriety (when/how long): NA  CIWA: CIWA-Ar BP: 145/73 Pulse Rate: 72 COWS:    PATIENT STRENGTHS: (choose at least two) Average or above average intelligence General fund of knowledge Special hobby/interest  Allergies:  Allergies  Allergen Reactions  . Bactrim [Sulfamethoxazole-Trimethoprim]    . Cephalosporins   . Penicillins   . Vancomycin Other (See Comments)    unknown    Home Medications:  (Not in a hospital admission)  OB/GYN Status:  No LMP recorded. Patient is postmenopausal.  General Assessment Data Location of Assessment: [facility] TTS Assessment: In system Is this a Tele or Face-to-Face Assessment?: Tele Assessment Is this an Initial Assessment or a Re-assessment for this encounter?: Initial Assessment Marital status: Widowed [place] name: UTA Is patient pregnant?: No Pregnancy Status: No Living Arrangements: Other (Comment) (CNA staying during the day and family rolates at night. ) Can pt return to current living arrangement?: Yes Admission Status: Involuntary Is patient capable of signing voluntary admission?: Yes Referral Source: Self/Family/Friend Insurance type: Medicare     Crisis Care Plan Living Arrangements: Other (Comment) (CNA staying during the day and family rolates at night. ) Legal Guardian: Other: (Self) Name of Psychiatrist: NA Name of Therapist: NA  Education Status Is patient currently in school?: No Current Grade: NA Highest grade of school patient has completed: 4th Name of school: NA Contact person: NA  Risk to self with the past 6 months Suicidal Ideation: No (Pt denies. ) Has patient been a risk to self within the past 6 months prior to admission? : No (Pt denies. ) Suicidal Intent: No Has patient had any suicidal intent within the past 6 months prior to admission? : No (Pt denies. ) Is patient at risk for suicide?: No Suicidal Plan?: No (Pt denies. ) Has patient had any suicidal plan within the past 6 months prior to admission? : No (Pt denies. ) Access to Means: No (Pt denies. ) What has been your use of drugs/alcohol within the last 12 months?: NA Previous Attempts/Gestures: No (Pt denies. ) How many times?: 0 Other Self Harm Risks: NA Triggers for Past Attempts: None known (Pt denies.) Intentional Self Injurious  Behavior: None (Pt denies. ) Family Suicide History: No (Pt denies.) Recent stressful life event(s):  (UTA) Persecutory voices/beliefs?: Yes Depression: No Depression Symptoms: Feeling angry/irritable Substance abuse history and/or treatment for substance abuse?: No Suicide prevention information given to non-admitted patients: Not applicable  Risk to Others within the past 6 months Homicidal Ideation: No (Pt denies. ) Does patient have any lifetime risk of violence toward others beyond the six months prior to admission? : No (Pt denies. ) Thoughts of Harm to Others: No (Pt denies.) Current Homicidal Intent: No (Pt denies.) Current Homicidal Plan: No (Pt denies.) Access to Homicidal Means: No (Pt denies.) Identified Victim: NA History of harm to others?: No (Pt denies. ) Assessment of Violence: None Noted Violent Behavior Description: NA Does patient have access to weapons?: No (Pt denies.) Criminal Charges Pending?: No Does patient have a court date: No Is patient on probation?: No  Psychosis Hallucinations: Auditory Delusions: Persecutory  Mental Status Report Appearance/Hygiene: In scrubs Eye Contact: Good Motor Activity: Unremarkable Speech: Soft (High pitched) Level of Consciousness: Alert Mood:  (Appropriate to circumstance.) Affect: Appropriate to circumstance Anxiety Level:  Minimal Thought Processes: Coherent, Relevant Judgement: Impaired Orientation: Person, Place, Time, Situation Obsessive Compulsive Thoughts/Behaviors: None  Cognitive Functioning Concentration: Fair Memory: Recent Intact, Remote Intact IQ: Average Insight: Poor Impulse Control: Poor Appetite: Good Weight Loss: 0 Weight Gain:  (0) Sleep: Decreased Total Hours of Sleep:  (4) Vegetative Symptoms: None  ADLScreening [facility] Assessment Services) Patient's cognitive ability adequate to safely complete daily activities?: Yes Patient able to express need for assistance with ADLs?:  Yes Independently performs ADLs?: No  Prior Inpatient Therapy Prior Inpatient Therapy: No Prior Therapy Dates: NA Prior Therapy Facilty/Provider(s): NA Reason for Treatment: NA  Prior Outpatient Therapy Prior Outpatient Therapy: No Prior Therapy Dates: NA Prior Therapy Facilty/Provider(s): NA Reason for Treatment: NA Does patient have an ACCT team?: No Does patient have Intensive In-House Services?  : No Does patient have [facility] services? : Unknown Does patient have P4CC services?: Unknown  ADL Screening (condition at time of admission) Patient's cognitive ability adequate to safely complete daily activities?: Yes Is the patient deaf or have difficulty hearing?: Yes (Son reports, pt (his mother) is very hard of hearing. ) Does the patient have difficulty seeing, even when wearing glasses/contacts?: No Does the patient have difficulty concentrating, remembering, or making decisions?: No (Pt denies.) Patient able to express need for assistance with ADLs?: Yes Does the patient have difficulty dressing or bathing?: Yes (Son reported pt (his mother) needs assistance bathing and dressing. ) Independently performs ADLs?: No Communication: Independent Dressing (OT): Needs assistance Is this a change from baseline?: Pre-admission baseline Grooming: Needs assistance Is this a change from baseline?: Pre-admission baseline Feeding: Independent Bathing: Needs assistance Is this a change from baseline?: Pre-admission baseline Toileting: Independent In/Out Bed: Needs assistance Is this a change from baseline?: Pre-admission baseline Walks in Home: Needs assistance Is this a change from baseline?: Pre-admission baseline Does the patient have difficulty walking or climbing stairs?: Yes (Son reported his mother has a to wear a boot, she loss feeling her in leg after a knee replacement surgery. ) Weakness of Legs: Left Weakness of Arms/Hands: None       Abuse/Neglect Assessment  (Assessment to be complete while patient is alone) Physical Abuse: Denies (Pt denies. ) Verbal Abuse: Denies (Pt denies.) Sexual Abuse: Denies (Pt denies. ) Exploitation of patient/patient's resources: Denies (Pt denies. ) Self-Neglect: Denies (Pt denies. )     Advance Directives (For Healthcare) Does patient have an advance directive?: No Would patient like information on creating an advanced directive?: No - patient declined information    Additional Information 1:1 In Past 12 Months?: No CIRT Risk: No Elopement Risk: No Does patient have medical clearance?: Yes   Disposition: Per Serena Colonel, NP, pt does not met inpatient criteria. Discussed disposition with Ron due to [employer], RN busy. Contact Dr. Roderic Palau for pt disposition. Dr. Roderic Palau wanted to know what the recommendation is for addressing the pt's hallucinations. Serena Colonel, NP reported she will contact Dr. Roderic Palau.   Disposition Initial Assessment Completed for this Encounter: Yes Disposition of Patient: Other dispositions  Edd Fabian 05/10/2016 8:42 PM   Edd Fabian, MS, [facility], [facility] Triage Specialist [phone]

## 2016-05-10 NOTE — ED Triage Notes (Signed)
Patient arrives via EMS from home with IVC paperwork in place. [employer] at bedside with patient. Per IVC, patient has had delusions of her daughter installing intercom in house to spy on her. [employer] has responded to house several times for patient complaints of someone shooting her dog (no current dog), her daughter died, and her son fell off roof. None of these occurrences have happened per IVC.

## 2016-05-11 DIAGNOSIS — R443 Hallucinations, unspecified: Secondary | ICD-10-CM | POA: Diagnosis not present

## 2016-05-11 MED ORDER — RISPERIDONE 0.5 MG PO TABS
ORAL_TABLET | ORAL | Status: AC
Start: 2016-05-11 — End: 2016-05-11
  Filled 2016-05-11: qty 1

## 2016-05-14 LAB — URINE CULTURE: Culture: >=100000 — AB

## 2016-05-15 ENCOUNTER — Telehealth (HOSPITAL_BASED_OUTPATIENT_CLINIC_OR_DEPARTMENT_OTHER): Payer: Self-pay | Admitting: Emergency Medicine

## 2016-05-15 NOTE — Telephone Encounter (Signed)
Post ED Visit - Positive Culture Follow-up  Culture report reviewed by antimicrobial stewardship pharmacist:  []  Enzo BiNathan Batchelder, Pharm.D. []  Celedonio MiyamotoJeremy Frens, Pharm.D., BCPS []  Garvin FilaMike Maccia, Pharm.D. []  Georgina PillionElizabeth Martin, Pharm.D., BCPS []  IntercourseMinh Pham, [address]Pharm[address]D., BCPS, AAHIVP []  Estella HuskMichelle Turner, Pharm.D., BCPS, AAHIVP []  Tennis Mustassie Stewart, Pharm.D. []  Sherle Poeob Vincent, [address]Ph[address] Vianne BullsKai Kong RPh  Positive urine culture Treated with ciprofloxacin, organism sensitive to the same and no further patient follow-up is required at this time.  Berle MullMiller, Izabella Marcantel 05/15/2016, 12:30 PM

## 2016-10-22 DEATH — deceased
